# Patient Record
Sex: Male | Born: 1975 | Race: Black or African American | Hispanic: No | Smoking: Never smoker
Health system: Southern US, Community
[De-identification: ages and names within clinical notes are randomized; demographics above are authoritative.]

## PROBLEM LIST (undated history)

## (undated) DIAGNOSIS — I10 Essential (primary) hypertension: Secondary | ICD-10-CM

## (undated) DIAGNOSIS — I82409 Acute embolism and thrombosis of unspecified deep veins of unspecified lower extremity: Secondary | ICD-10-CM

## (undated) DIAGNOSIS — F329 Major depressive disorder, single episode, unspecified: Secondary | ICD-10-CM

## (undated) DIAGNOSIS — K219 Gastro-esophageal reflux disease without esophagitis: Secondary | ICD-10-CM

## (undated) DIAGNOSIS — F419 Anxiety disorder, unspecified: Secondary | ICD-10-CM

## (undated) DIAGNOSIS — F32A Depression, unspecified: Secondary | ICD-10-CM

## (undated) DIAGNOSIS — E119 Type 2 diabetes mellitus without complications: Secondary | ICD-10-CM

## (undated) DIAGNOSIS — E785 Hyperlipidemia, unspecified: Secondary | ICD-10-CM

## (undated) HISTORY — PX: HERNIA REPAIR: SHX51

---

## 2015-03-26 ENCOUNTER — Encounter (HOSPITAL_BASED_OUTPATIENT_CLINIC_OR_DEPARTMENT_OTHER): Payer: Self-pay | Admitting: Emergency Medicine

## 2015-03-26 ENCOUNTER — Emergency Department (HOSPITAL_BASED_OUTPATIENT_CLINIC_OR_DEPARTMENT_OTHER)
Admission: EM | Admit: 2015-03-26 | Discharge: 2015-03-26 | Disposition: A | Discharge status: Home / Self Care | Payer: BLUE CROSS/BLUE SHIELD | Attending: Emergency Medicine | Admitting: Emergency Medicine

## 2015-03-26 ENCOUNTER — Emergency Department (HOSPITAL_BASED_OUTPATIENT_CLINIC_OR_DEPARTMENT_OTHER): Payer: BLUE CROSS/BLUE SHIELD

## 2015-03-26 DIAGNOSIS — I10 Essential (primary) hypertension: Secondary | ICD-10-CM | POA: Diagnosis not present

## 2015-03-26 DIAGNOSIS — J069 Acute upper respiratory infection, unspecified: Secondary | ICD-10-CM | POA: Insufficient documentation

## 2015-03-26 DIAGNOSIS — R05 Cough: Secondary | ICD-10-CM | POA: Diagnosis present

## 2015-03-26 HISTORY — DX: Essential (primary) hypertension: I10

## 2015-03-26 MED ORDER — IBUPROFEN 800 MG PO TABS
800.0000 mg | ORAL_TABLET | Freq: Once | ORAL | Status: AC
  Administered 2015-03-26: 800 mg via ORAL
  Filled 2015-03-26: qty 1

## 2015-03-26 NOTE — ED Provider Notes (Signed)
TIME SEEN: 11:40 AM  CHIEF COMPLAINT: Cough  HPI: Pt is a 39 y.o. male with history of hypertension who presents to the emergency department with 4 days of cough with yellow sputum. States his sputum was blood streaked today and he was concerned so he came to the emergency department. Has had diffuse throbbing headache, subjective fevers. He states he has felt weak today. No nausea or vomiting. Did have 2 episodes of nonbloody diarrhea. States he has not had any sick contacts or recent travel. He did have an influenza vaccination last year. No history of PE or DVT, lower extremity swelling or pain, recent prolonged immobilization such as hospitalization, long flight, fracture, surgery, trauma.  ROS: See HPI Constitutional: Subjective fever  Eyes: no drainage  ENT: no runny nose   Cardiovascular:  no chest pain  Resp: no SOB  GI: no vomiting GU: no dysuria Integumentary: no rash  Allergy: no hives  Musculoskeletal: no leg swelling  Neurological: no slurred speech ROS otherwise negative  PAST MEDICAL HISTORY/PAST SURGICAL HISTORY:  Past Medical History  Diagnosis Date  . Hypertension     MEDICATIONS:  Prior to Admission medications   Not on File    ALLERGIES:  No Known Allergies  SOCIAL HISTORY:  History  Substance Use Topics  . Smoking status: Never Smoker   . Smokeless tobacco: Never Used  . Alcohol Use: Yes     Comment: occasional    FAMILY HISTORY: History reviewed. No pertinent family history.  EXAM: BP 148/98 mmHg  Pulse 97  Temp(Src) 98.7 F (37.1 C) (Oral)  Resp 18  Ht 5\' 9"  (1.753 m)  Wt 224 lb (101.606 kg)  BMI 33.06 kg/m2  SpO2 98% CONSTITUTIONAL: Alert and oriented and responds appropriately to questions. Well-appearing; well-nourished, nontoxic, in no distress HEAD: Normocephalic EYES: Conjunctivae clear, PERRL ENT: normal nose; no rhinorrhea; moist mucous membranes; pharynx without lesions noted NECK: Supple, no meningismus, no LAD  CARD: RRR;  S1 and S2 appreciated; no murmurs, no clicks, no rubs, no gallops RESP: Normal chest excursion without splinting or tachypnea; breath sounds clear and equal bilaterally; no wheezes, no rhonchi, no rales, no hypoxia or respiratory distress, speaking full sentences ABD/GI: Normal bowel sounds; non-distended; soft, non-tender, no rebound, no guarding, no peritoneal signs BACK:  The back appears normal and is non-tender to palpation, there is no CVA tenderness EXT: Normal ROM in all joints; non-tender to palpation; no edema; normal capillary refill; no cyanosis, no calf tenderness or swelling    SKIN: Normal color for age and race; warm NEURO: Moves all extremities equally, sensation to light touch intact diffusely, cranial nerves II through XII intact PSYCH: The patient's mood and manner are appropriate. Grooming and personal hygiene are appropriate.  MEDICAL DECISION MAKING: Patient here with likely viral URI. Will give ibuprofen for his headache. Chest x-ray shows no acute abnormality. I do not feel he needs to be on antibiotics at this time. Discussed return precautions. I feel he is safe to be discharged home. He verbalizes understanding and is comfortable with plan.       Layla Maw Mignon Bechler, DO 03/26/15 1540

## 2015-03-26 NOTE — Discharge Instructions (Signed)
You may take Ibuprofen 800 mg every 8 hours as needed for fever and pain and Tylenol 1000 mg every 6 hours as needed for fever and pain. Both of these medications are found over-the-counter. Your chest x-ray today was clear and showed no pneumonia.   Upper Respiratory Infection, Adult An upper respiratory infection (URI) is also sometimes known as the common cold. The upper respiratory tract includes the nose, sinuses, throat, trachea, and bronchi. Bronchi are the airways leading to the lungs. Most people improve within 1 week, but symptoms can last up to 2 weeks. A residual cough may last even longer.  CAUSES Many different viruses can infect the tissues lining the upper respiratory tract. The tissues become irritated and inflamed and often become very moist. Mucus production is also common. A cold is contagious. You can easily spread the virus to others by oral contact. This includes kissing, sharing a glass, coughing, or sneezing. Touching your mouth or nose and then touching a surface, which is then touched by another person, can also spread the virus. SYMPTOMS  Symptoms typically develop 1 to 3 days after you come in contact with a cold virus. Symptoms vary from person to person. They may include:  Runny nose.  Sneezing.  Nasal congestion.  Sinus irritation.  Sore throat.  Loss of voice (laryngitis).  Cough.  Fatigue.  Muscle aches.  Loss of appetite.  Headache.  Low-grade fever. DIAGNOSIS  You might diagnose your own cold based on familiar symptoms, since most people get a cold 2 to 3 times a year. Your caregiver can confirm this based on your exam. Most importantly, your caregiver can check that your symptoms are not due to another disease such as strep throat, sinusitis, pneumonia, asthma, or epiglottitis. Blood tests, throat tests, and X-rays are not necessary to diagnose a common cold, but they may sometimes be helpful in excluding other more serious diseases. Your  caregiver will decide if any further tests are required. RISKS AND COMPLICATIONS  You may be at risk for a more severe case of the common cold if you smoke cigarettes, have chronic heart disease (such as heart failure) or lung disease (such as asthma), or if you have a weakened immune system. The very young and very old are also at risk for more serious infections. Bacterial sinusitis, middle ear infections, and bacterial pneumonia can complicate the common cold. The common cold can worsen asthma and chronic obstructive pulmonary disease (COPD). Sometimes, these complications can require emergency medical care and may be life-threatening. PREVENTION  The best way to protect against getting a cold is to practice good hygiene. Avoid oral or hand contact with people with cold symptoms. Wash your hands often if contact occurs. There is no clear evidence that vitamin C, vitamin E, echinacea, or exercise reduces the chance of developing a cold. However, it is always recommended to get plenty of rest and practice good nutrition. TREATMENT  Treatment is directed at relieving symptoms. There is no cure. Antibiotics are not effective, because the infection is caused by a virus, not by bacteria. Treatment may include:  Increased fluid intake. Sports drinks offer valuable electrolytes, sugars, and fluids.  Breathing heated mist or steam (vaporizer or shower).  Eating chicken soup or other clear broths, and maintaining good nutrition.  Getting plenty of rest.  Using gargles or lozenges for comfort.  Controlling fevers with ibuprofen or acetaminophen as directed by your caregiver.  Increasing usage of your inhaler if you have asthma. Zinc gel and zinc  lozenges, taken in the first 24 hours of the common cold, can shorten the duration and lessen the severity of symptoms. Pain medicines may help with fever, muscle aches, and throat pain. A variety of non-prescription medicines are available to treat congestion  and runny nose. Your caregiver can make recommendations and may suggest nasal or lung inhalers for other symptoms.  HOME CARE INSTRUCTIONS   Only take over-the-counter or prescription medicines for pain, discomfort, or fever as directed by your caregiver.  Use a warm mist humidifier or inhale steam from a shower to increase air moisture. This may keep secretions moist and make it easier to breathe.  Drink enough water and fluids to keep your urine clear or pale yellow.  Rest as needed.  Return to work when your temperature has returned to normal or as your caregiver advises. You may need to stay home longer to avoid infecting others. You can also use a face mask and careful hand washing to prevent spread of the virus. SEEK MEDICAL CARE IF:   After the first few days, you feel you are getting worse rather than better.  You need your caregiver's advice about medicines to control symptoms.  You develop chills, worsening shortness of breath, or brown or red sputum. These may be signs of pneumonia.  You develop yellow or brown nasal discharge or pain in the face, especially when you bend forward. These may be signs of sinusitis.  You develop a fever, swollen neck glands, pain with swallowing, or white areas in the back of your throat. These may be signs of strep throat. SEEK IMMEDIATE MEDICAL CARE IF:   You have a fever.  You develop severe or persistent headache, ear pain, sinus pain, or chest pain.  You develop wheezing, a prolonged cough, cough up blood, or have a change in your usual mucus (if you have chronic lung disease).  You develop sore muscles or a stiff neck. Document Released: 03/26/2001 Document Revised: 12/23/2011 Document Reviewed: 01/05/2014 [facility] Patient Information 2015 [place], [place]. This information is not intended to replace advice given to you by your health care provider. Make sure you discuss any questions you have with your health care provider.

## 2015-03-26 NOTE — ED Notes (Signed)
Pt in c/o not feeling well x 4 days. States fatigue and nasal congestion, also productive cough with yellow sputum and some blood.

## 2017-12-28 ENCOUNTER — Encounter (HOSPITAL_BASED_OUTPATIENT_CLINIC_OR_DEPARTMENT_OTHER): Payer: Self-pay | Admitting: *Deleted

## 2017-12-28 ENCOUNTER — Other Ambulatory Visit: Payer: Self-pay

## 2017-12-28 ENCOUNTER — Emergency Department (HOSPITAL_BASED_OUTPATIENT_CLINIC_OR_DEPARTMENT_OTHER)
Admission: EM | Admit: 2017-12-28 | Discharge: 2017-12-28 | Disposition: A | Discharge status: Home / Self Care | Payer: BLUE CROSS/BLUE SHIELD | Attending: Emergency Medicine | Admitting: Emergency Medicine

## 2017-12-28 DIAGNOSIS — Z79899 Other long term (current) drug therapy: Secondary | ICD-10-CM | POA: Insufficient documentation

## 2017-12-28 DIAGNOSIS — I10 Essential (primary) hypertension: Secondary | ICD-10-CM | POA: Diagnosis not present

## 2017-12-28 DIAGNOSIS — K625 Hemorrhage of anus and rectum: Secondary | ICD-10-CM | POA: Diagnosis present

## 2017-12-28 NOTE — Discharge Instructions (Signed)
You may use MiraLAX once or twice a day to help keep your stool soft.  To find a primary care or specialty doctor please call [phone] or [phone] to access "[facility] Find a Doctor Service."  You may also go on the [facility] website at [email or web address]  There are also multiple [facility], [facility], [facility] and [facility] practices throughout [place] that are frequently accepting new patients. You may find a clinic that is close to your home and contact them.  [facility] -  [address] [place] [phone]   [facility] -  [address] [place] [id number] [phone]   [facility] [phone]  [place] [phone]

## 2017-12-28 NOTE — ED Provider Notes (Signed)
TIME SEEN: 1:39 AM  CHIEF COMPLAINT: Rectal bleeding  HPI: Patient is a 42 year old male with history of hypertension who presents to the emergency department with rectal bleeding for the past 2 days.  States he is seen a small amount of blood on the toilet paper in the toilet after bowel movements.  Reports his bowel movements have been harder than normal.  No black or tarry stools.  Has had some intermittent abdominal cramping but none currently.  No fever.  No vomiting.  Not on blood thinners.  States he became concerned tonight at work when he saw blood in his toilet paper again and left work for evaluation.  States he is concerned about colon cancer.  No history of colonoscopy.  No family history of colon cancer.  ROS: See HPI Constitutional: no fever  Eyes: no drainage  ENT: no runny nose   Cardiovascular:  no chest pain  Resp: no SOB  GI: no vomiting GU: no dysuria Integumentary: no rash  Allergy: no hives  Musculoskeletal: no leg swelling  Neurological: no slurred speech ROS otherwise negative  PAST MEDICAL HISTORY/PAST SURGICAL HISTORY:  Past Medical History:  Diagnosis Date  . Hypertension     MEDICATIONS:  Prior to Admission medications   Medication Sig Start Date End Date Taking? Authorizing Provider  lisinopril (PRINIVIL,ZESTRIL) 20 MG tablet Take 20 mg by mouth daily.   Yes [provider]    ALLERGIES:  No Known Allergies  SOCIAL HISTORY:  Social History   Tobacco Use  . Smoking status: Never Smoker  . Smokeless tobacco: Never Used  Substance Use Topics  . Alcohol use: No    Frequency: Never    Comment: occasional    FAMILY HISTORY: No family history on file.  EXAM: BP (!) 161/117 (BP Location: Right Arm)   Pulse 98   Temp 99.3 F (37.4 C) (Oral)   Resp 18   Ht 6\' 1"  (1.854 m)   Wt 101.6 kg (224 lb)   SpO2 98%   BMI 29.55 kg/m  CONSTITUTIONAL: Alert and oriented and responds appropriately to questions. Well-appearing;  well-nourished HEAD: Normocephalic EYES: Conjunctivae clear, pupils appear equal, EOMI ENT: normal nose; moist mucous membranes, pink conjunctiva NECK: Supple, no meningismus, no nuchal rigidity, no LAD  CARD: RRR; S1 and S2 appreciated; no murmurs, no clicks, no rubs, no gallops RESP: Normal chest excursion without splinting or tachypnea; breath sounds clear and equal bilaterally; no wheezes, no rhonchi, no rales, no hypoxia or respiratory distress, speaking full sentences ABD/GI: Normal bowel sounds; non-distended; soft, non-tender, no rebound, no guarding, no peritoneal signs, no hepatosplenomegaly RECTAL:  Normal rectal tone, no gross blood or melena, no hemorrhoids appreciated, nontender rectal exam, no fecal impaction BACK:  The back appears normal and is non-tender to palpation, there is no CVA tenderness EXT: Normal ROM in all joints; non-tender to palpation; no edema; normal capillary refill; no cyanosis, no calf tenderness or swelling    SKIN: Normal color for age and race; warm; no rash NEURO: Moves all extremities equally PSYCH: The patient's mood and manner are appropriate. Grooming and personal hygiene are appropriate.  MEDICAL DECISION MAKING: Patient here with rectal bleeding.  There is no blood seen on rectal exam currently.  Abdominal exam benign.  Suspect constipation, internal hemorrhoids.  Will have him follow-up with GI as an outpatient if symptoms continue.  Doubt diverticulosis, diverticulitis.  Doubt upper GI bleed.  Nothing to suggest hemorrhage.  Hemodynamically stable.  Recommended MiraLAX and high-fiber diet to  keep his stool soft.  Of note patient is also hypertensive.  He is asymptomatic at this time.  He is on lisinopril.  Have advised him to continue this medication and follow-up closely with his primary care physician.   At this time, I do not feel there is any life-threatening condition present. I have reviewed and discussed all results (EKG, imaging, lab, urine  as appropriate) and exam findings with patient/family. I have reviewed nursing notes and appropriate previous records.  I feel the patient is safe to be discharged home without further emergent workup and can continue workup as an outpatient as needed. Discussed usual and customary return precautions. Patient/family verbalize understanding and are comfortable with this plan.  Outpatient follow-up has been provided if needed. All questions have been answered.      Ward, Layla MawKristen N, DO 12/28/17 [phone]

## 2017-12-28 NOTE — ED Notes (Signed)
ED Provider at bedside. 

## 2017-12-28 NOTE — ED Triage Notes (Signed)
Pt reports painful bm and rectal bleeding when he wiped. Also c/o generalized back pain x 1 week. Ambulatory to room 12 without difficulty

## 2017-12-28 NOTE — ED Notes (Signed)
Pt states about 2 weeks ago, he noticed some pain in his groin and pressure from his lower back up to his neck. Last Wed., noticed a small blood clot on his stool in the toilet. Had been drinking cranberry juice. Tonight, noticed blood in the toilet water again and became concerned. Also [facility] and "not had any [facility]." BBS-clr.

## 2017-12-28 NOTE — ED Notes (Signed)
Pt given d/c instructions as per chart. Verbalizes understanding. No questions. 

## 2017-12-30 ENCOUNTER — Encounter: Payer: Self-pay | Admitting: Gastroenterology

## 2018-02-12 ENCOUNTER — Ambulatory Visit: Payer: BLUE CROSS/BLUE SHIELD | Admitting: Gastroenterology

## 2018-04-17 ENCOUNTER — Encounter (HOSPITAL_BASED_OUTPATIENT_CLINIC_OR_DEPARTMENT_OTHER): Payer: Self-pay | Admitting: *Deleted

## 2018-04-17 ENCOUNTER — Other Ambulatory Visit: Payer: Self-pay

## 2018-04-17 ENCOUNTER — Emergency Department (HOSPITAL_BASED_OUTPATIENT_CLINIC_OR_DEPARTMENT_OTHER)
Admission: EM | Admit: 2018-04-17 | Discharge: 2018-04-17 | Disposition: A | Discharge status: Home / Self Care | Payer: BLUE CROSS/BLUE SHIELD | Attending: Emergency Medicine | Admitting: Emergency Medicine

## 2018-04-17 DIAGNOSIS — I10 Essential (primary) hypertension: Secondary | ICD-10-CM | POA: Diagnosis not present

## 2018-04-17 DIAGNOSIS — K625 Hemorrhage of anus and rectum: Secondary | ICD-10-CM | POA: Insufficient documentation

## 2018-04-17 DIAGNOSIS — Z79899 Other long term (current) drug therapy: Secondary | ICD-10-CM | POA: Insufficient documentation

## 2018-04-17 HISTORY — DX: Major depressive disorder, single episode, unspecified: F32.9

## 2018-04-17 HISTORY — DX: Anxiety disorder, unspecified: F41.9

## 2018-04-17 HISTORY — DX: Depression, unspecified: F32.A

## 2018-04-17 HISTORY — DX: Hyperlipidemia, unspecified: E78.5

## 2018-04-17 HISTORY — DX: Gastro-esophageal reflux disease without esophagitis: K21.9

## 2018-04-17 LAB — CBC WITH DIFFERENTIAL/PLATELET
BASOS ABS: 0.1 10*3/uL (ref 0.0–0.1)
BASOS PCT: 1 %
Eosinophils Absolute: 0.1 10*3/uL (ref 0.0–0.7)
Eosinophils Relative: 1 %
HCT: 38.1 % — ABNORMAL LOW (ref 39.0–52.0)
HEMOGLOBIN: 12.9 g/dL — AB (ref 13.0–17.0)
Lymphocytes Relative: 31 %
Lymphs Abs: 2.3 10*3/uL (ref 0.7–4.0)
MCH: 29.3 pg (ref 26.0–34.0)
MCHC: 33.9 g/dL (ref 30.0–36.0)
MCV: 86.4 fL (ref 78.0–100.0)
Monocytes Absolute: 0.8 10*3/uL (ref 0.1–1.0)
Monocytes Relative: 10 %
NEUTROS ABS: 4.4 10*3/uL (ref 1.7–7.7)
Neutrophils Relative %: 57 %
Platelets: 331 10*3/uL (ref 150–400)
RBC: 4.41 MIL/uL (ref 4.22–5.81)
RDW: 12.1 % (ref 11.5–15.5)
WBC: 7.6 10*3/uL (ref 4.0–10.5)

## 2018-04-17 LAB — PROTIME-INR
INR: 1.21
PROTHROMBIN TIME: 15.2 s (ref 11.4–15.2)

## 2018-04-17 LAB — BASIC METABOLIC PANEL
ANION GAP: 9 (ref 5–15)
BUN: 13 mg/dL (ref 6–20)
CALCIUM: 8.9 mg/dL (ref 8.9–10.3)
CO2: 25 mmol/L (ref 22–32)
Chloride: 104 mmol/L (ref 98–111)
Creatinine, Ser: 0.97 mg/dL (ref 0.61–1.24)
GFR calc Af Amer: >60 mL/min (ref >60)
GFR calc non Af Amer: >60 mL/min (ref >60)
GLUCOSE: 108 mg/dL — AB (ref 70–99)
Potassium: 3.8 mmol/L (ref 3.5–5.1)
Sodium: 138 mmol/L (ref 135–145)

## 2018-04-17 LAB — URINALYSIS, ROUTINE W REFLEX MICROSCOPIC
Bilirubin Urine: NEGATIVE
GLUCOSE, UA: NEGATIVE mg/dL
Hgb urine dipstick: NEGATIVE
Ketones, ur: NEGATIVE mg/dL
Nitrite: NEGATIVE
PROTEIN: NEGATIVE mg/dL
Specific Gravity, Urine: 1.01 (ref 1.005–1.030)
pH: 6.5 (ref 5.0–8.0)

## 2018-04-17 LAB — URINALYSIS, MICROSCOPIC (REFLEX)

## 2018-04-17 LAB — OCCULT BLOOD X 1 CARD TO LAB, STOOL: Fecal Occult Bld: POSITIVE — AB

## 2018-04-17 MED ORDER — DOCUSATE SODIUM 100 MG PO CAPS: 100.0000 mg | ORAL_CAPSULE | Freq: Two times a day (BID) | ORAL | 0 refills | Status: DC

## 2018-04-17 NOTE — ED Notes (Signed)
Pt is actually complaining of bright red blood from rectum when trying to have a bowel movement, hx of hemorrhoids, denies dizziness, denies SOB, denies abdominal pain

## 2018-04-17 NOTE — ED Notes (Signed)
Pt was sent from Urgent Care

## 2018-04-17 NOTE — ED Notes (Signed)
Pt verbalizes understanding of d/c instructions and denies any further needs at this time. 

## 2018-04-17 NOTE — ED Provider Notes (Signed)
[facility] EMERGENCY DEPARTMENT Provider Note   CSN: [id number] Arrival date & time: 04/17/18  1821     History   Chief Complaint Chief Complaint  Patient presents with  . Rectal Bleeding    HPI Jerome Dickerson is a 42 y.o. male.  HPI Patient referred from PCP office for evaluation of rectal bleeding.  Patient reports that today he had been doing normal activities.  He then went into the bathroom and it seemed like a spurt of red blood came out of his rectum.  He denies he was straining hard at stool.  Reports it happened another time and a small piece of stool came out with it.  Again, this was bright red blood that he reports seem to spray out.  He denies any rectal pain.  Abdominal pain.  No lightheadedness no dizziness.  He reports he is otherwise been feeling well.  He reports sometimes he does have constipation and then amends his diet.  Last time he thought he was constipated was about 2 weeks ago.  No history of colonoscopy. Past Medical History:  Diagnosis Date  . Anxiety   . Depression   . GERD (gastroesophageal reflux disease)   . Hyperlipemia   . Hypertension     There are no active problems to display for this patient.   Past Surgical History:  Procedure Laterality Date  . HERNIA REPAIR          Home Medications    Prior to Admission medications   Medication Sig Start Date End Date Taking? Authorizing Provider  lisinopril (PRINIVIL,ZESTRIL) 20 MG tablet Take 20 mg by mouth daily.   Yes [provider]  docusate sodium (COLACE) 100 MG capsule Take 1 capsule (100 mg total) by mouth every 12 (twelve) hours. 04/17/18   Arby BarrettePfeiffer, Rondale Nies, MD    Family History History reviewed. No pertinent family history.  Social History Social History   Tobacco Use  . Smoking status: Never Smoker  . Smokeless tobacco: Never Used  Substance Use Topics  . Alcohol use: No    Frequency: Never    Comment: occasional  . Drug use: No     Allergies     Patient has no known allergies.   Review of Systems Review of Systems 10 Systems reviewed and are negative for acute change except as noted in the HPI.   Physical Exam Updated Vital Signs BP (!) 157/102 (BP Location: Left Arm)   Pulse 75   Temp 98.6 F (37 C) (Oral)   Resp 20   Ht 6\' 1"  (1.854 m)   Wt 108.9 kg (240 lb)   SpO2 96%   BMI 31.66 kg/m   Physical Exam  Constitutional: He is oriented to person, place, and time. He appears well-developed and well-nourished.  HENT:  Head: Normocephalic and atraumatic.  Eyes: EOM are normal.  Neck: Neck supple.  Cardiovascular: Normal rate, regular rhythm, normal heart sounds and intact distal pulses.  Pulmonary/Chest: Effort normal and breath sounds normal.  Abdominal: Soft. Bowel sounds are normal. He exhibits no distension. There is no tenderness. There is no guarding.  Genitourinary:  Genitourinary Comments: One nonthrombosed hemorrhoid.  No blood visible blood in the rectal vault.  Trace brownish-yellow stool.  Musculoskeletal: Normal range of motion. He exhibits no edema.  Neurological: He is alert and oriented to person, place, and time. He has normal strength. Coordination normal. GCS eye subscore is 4. GCS verbal subscore is 5. GCS motor subscore is 6.  Skin: Skin  is warm, dry and intact.  Psychiatric: He has a normal mood and affect.     ED Treatments / Results  Labs (all labs ordered are listed, but only abnormal results are displayed) Labs Reviewed  URINALYSIS, ROUTINE W REFLEX MICROSCOPIC - Abnormal; Notable for the following components:      Result Value   Leukocytes, UA TRACE (*)    All other components within normal limits  URINALYSIS, MICROSCOPIC (REFLEX) - Abnormal; Notable for the following components:   Bacteria, UA FEW (*)    All other components within normal limits  BASIC METABOLIC PANEL - Abnormal; Notable for the following components:   Glucose, Bld 108 (*)    All other components within normal  limits  CBC WITH DIFFERENTIAL/PLATELET - Abnormal; Notable for the following components:   Hemoglobin 12.9 (*)    HCT 38.1 (*)    All other components within normal limits  PROTIME-INR  OCCULT BLOOD X 1 CARD TO LAB, STOOL    EKG None  Radiology No results found.  Procedures Procedures (including critical care time)  Medications Ordered in ED Medications - No data to display   Initial Impression / Assessment and Plan / ED Course  I have reviewed the triage vital signs and the nursing notes.  Pertinent labs & imaging results that were available during my care of the patient were reviewed by me and considered in my medical decision making (see chart for details).     Final Clinical Impressions(s) / ED Diagnoses   Final diagnoses:  Rectal bleeding   Patient describes bright red rectal bleeding today.  I highly suspect hemorrhoidal bleeding.  Exam documented earlier PCP showed some trace bright red blood.  Exam that I have performed in the emergency department does not show any macroscopically visible blood at this time.  Patient does have blood count of 12.9 mg/dl that I have no old comparison available.  Clinically, patient shows no signs of anemia.  He is not hypotensive, tachycardic or symptomatic.  At this time, I feel patient stable for discharge with follow-up with his PCP to arrange outpatient colonoscopy.  He is counseled on necessity to return should he develop symptomatic anemia or increasing blood loss. ED Discharge Orders        Ordered    docusate sodium (COLACE) 100 MG capsule  Every 12 hours     04/17/18 2034       Arby Barrette, MD 04/17/18 2044

## 2018-04-17 NOTE — ED Triage Notes (Signed)
Pt had blood in urine several times today.

## 2018-04-17 NOTE — Discharge Instructions (Signed)
1.  Start taking Colace twice daily. 2.  Try not to strain or push hard while having a bowel movement. 3.  Schedule follow-up with your family doctor next week to discuss follow-up colonoscopy. 4.  Return to the emergency department if you develop lightheadedness, feeling generally weak, feeling like you will pass out, shortness of breath or increasing volume of blood.

## 2018-08-16 DIAGNOSIS — I10 Essential (primary) hypertension: Secondary | ICD-10-CM | POA: Diagnosis not present

## 2018-08-16 DIAGNOSIS — M79642 Pain in left hand: Secondary | ICD-10-CM | POA: Diagnosis present

## 2018-08-16 DIAGNOSIS — R2232 Localized swelling, mass and lump, left upper limb: Secondary | ICD-10-CM | POA: Diagnosis not present

## 2018-08-17 ENCOUNTER — Other Ambulatory Visit: Payer: Self-pay

## 2018-08-17 ENCOUNTER — Emergency Department (HOSPITAL_BASED_OUTPATIENT_CLINIC_OR_DEPARTMENT_OTHER)
Admission: EM | Admit: 2018-08-17 | Discharge: 2018-08-17 | Disposition: A | Discharge status: Home / Self Care | Payer: BLUE CROSS/BLUE SHIELD | Attending: Emergency Medicine | Admitting: Emergency Medicine

## 2018-08-17 ENCOUNTER — Encounter (HOSPITAL_BASED_OUTPATIENT_CLINIC_OR_DEPARTMENT_OTHER): Payer: Self-pay | Admitting: *Deleted

## 2018-08-17 DIAGNOSIS — M7989 Other specified soft tissue disorders: Secondary | ICD-10-CM

## 2018-08-17 MED ORDER — AMLODIPINE BESYLATE 5 MG PO TABS
5.0000 mg | ORAL_TABLET | Freq: Once | ORAL | Status: AC
  Administered 2018-08-17: 5 mg via ORAL
  Filled 2018-08-17: qty 1

## 2018-08-17 MED ORDER — AMLODIPINE BESYLATE 5 MG PO TABS: 5.0000 mg | ORAL_TABLET | Freq: Every day | ORAL | 1 refills | Status: DC

## 2018-08-17 MED ORDER — ACETAMINOPHEN 500 MG PO TABS
1000.0000 mg | ORAL_TABLET | Freq: Once | ORAL | Status: AC
  Administered 2018-08-17: 1000 mg via ORAL
  Filled 2018-08-17: qty 2

## 2018-08-17 NOTE — ED Triage Notes (Signed)
C/o left hand pain and swelling since Friday. Denies injury/illness

## 2018-08-17 NOTE — ED Provider Notes (Signed)
[facility] EMERGENCY DEPARTMENT Provider Note  CSN: [id number] Arrival date & time: 08/16/18 2358  Chief Complaint(s) Hand Pain  HPI Jerome Dickerson is a 42 y.o. male   The history is provided by the patient.  Hand Pain  This is a new problem. The current episode started 2 days ago. The problem occurs constantly. The problem has been gradually worsening. Pertinent negatives include no chest pain, no abdominal pain, no headaches and no shortness of breath. Nothing aggravates the symptoms. Nothing relieves the symptoms. He has tried nothing for the symptoms.   Patient denies trauma or injury to the hand. No drug use. No h/o gout.  No prior or recent STD.   Past Medical History Past Medical History:  Diagnosis Date  . Anxiety   . Depression   . GERD (gastroesophageal reflux disease)   . Hyperlipemia   . Hypertension    There are no active problems to display for this patient.  Home Medication(s) Prior to Admission medications   Medication Sig Start Date End Date Taking? Authorizing Provider  amLODipine (NORVASC) 5 MG tablet Take 1 tablet (5 mg total) by mouth daily for 7 days. 08/17/18 08/24/18  Nira Conn, MD  docusate sodium (COLACE) 100 MG capsule Take 1 capsule (100 mg total) by mouth every 12 (twelve) hours. 04/17/18   Arby Barrette, MD                                                                                                                                    Past Surgical History Past Surgical History:  Procedure Laterality Date  . HERNIA REPAIR     Family History No family history on file.  Social History Social History   Tobacco Use  . Smoking status: Never Smoker  . Smokeless tobacco: Never Used  Substance Use Topics  . Alcohol use: Yes    Frequency: Never    Comment: occasional  . Drug use: No   Allergies Patient has no known allergies.  Review of Systems Review of Systems  Respiratory: Negative for shortness of breath.     Cardiovascular: Negative for chest pain.  Gastrointestinal: Negative for abdominal pain.  Neurological: Negative for headaches.   All other systems are reviewed and are negative for acute change except as noted in the HPI  Physical Exam Vital Signs  I have reviewed the triage vital signs BP (!) 166/114   Pulse 84   Temp 98.5 F (36.9 C) (Oral)   Resp 16   SpO2 98%   Physical Exam  Constitutional: He is oriented to person, place, and time. He appears well-developed and well-nourished. No distress.  HENT:  Head: Normocephalic and atraumatic.  Right Ear: External ear normal.  Left Ear: External ear normal.  Nose: Nose normal.  Mouth/Throat: Mucous membranes are normal. No trismus in the jaw.  Eyes: Conjunctivae and EOM are normal. No scleral icterus.  Neck: Normal range of  motion and phonation normal.  Cardiovascular: Normal rate and regular rhythm.  Pulmonary/Chest: Effort normal. No stridor. No respiratory distress.  Abdominal: He exhibits no distension.  Musculoskeletal: Normal range of motion. He exhibits no edema.       Hands: Neurological: He is alert and oriented to person, place, and time.  Skin: He is not diaphoretic.  Psychiatric: He has a normal mood and affect. His behavior is normal.  Vitals reviewed.   ED Results and Treatments Labs (all labs ordered are listed, but only abnormal results are displayed) Labs Reviewed - No data to display                                                                                                                       EKG  EKG Interpretation  Date/Time:    Ventricular Rate:    PR Interval:    QRS Duration:   QT Interval:    QTC Calculation:   R Axis:     Text Interpretation:        Radiology No results found. Pertinent labs & imaging results that were available during my care of the patient were reviewed by me and considered in my medical decision making (see chart for details).  Medications Ordered in  ED Medications  acetaminophen (TYLENOL) tablet 1,000 mg (1,000 mg Oral Given 08/17/18 0048)  amLODipine (NORVASC) tablet 5 mg (5 mg Oral Given 08/17/18 0048)                                                                                                                                    Procedures Procedures  (including critical care time)  Medical Decision Making / ED Course I have reviewed the nursing notes for this encounter and the patient's prior records (if available in EHR or on provided paperwork).    Patient presents with left hand swelling.  He denies any trauma, recent infections.  Doubt septic arthritis or STD related arthritis.  Doubt gout.  Patient is on lisinopril.  Believe this is angioedema of the hand.  Will discontinue ACE inhibitor and place patient on amlodipine.  Recommend close follow-up with his PCP this week for reassessment and to ensure amlodipine will be appropriate for the patient.  The patient appears reasonably screened and/or stabilized for discharge and I doubt any other medical condition or other [facility] requiring further screening, evaluation, or treatment in the ED at this time  prior to discharge.  The patient is safe for discharge with strict return precautions.   Final Clinical Impression(s) / ED Diagnoses Final diagnoses:  Swelling of left hand    Disposition: Discharge  Condition: Good  I have discussed the results, Dx and Tx plan with the patient who expressed understanding and agree(s) with the plan. Discharge instructions discussed at great length. The patient was given strict return precautions who verbalized understanding of the instructions. No further questions at time of discharge.    ED Discharge Orders         Ordered    amLODipine (NORVASC) 5 MG tablet  Daily     08/17/18 0158           Follow Up: Ananias Pilgrim, MD [address] [place] [address] [phone]  Schedule an appointment as soon as  possible for a visit  in 3-5 days, for close follow up to assess for possible angioedema from Lisinopril     This chart was dictated using voice recognition software.  Despite best efforts to proofread,  errors can occur which can change the documentation meaning.   Nira Conn, MD 08/17/18 [phone]

## 2019-06-11 ENCOUNTER — Emergency Department (HOSPITAL_BASED_OUTPATIENT_CLINIC_OR_DEPARTMENT_OTHER): Payer: BC Managed Care – PPO

## 2019-06-11 ENCOUNTER — Encounter (HOSPITAL_BASED_OUTPATIENT_CLINIC_OR_DEPARTMENT_OTHER): Payer: Self-pay

## 2019-06-11 ENCOUNTER — Other Ambulatory Visit: Payer: Self-pay

## 2019-06-11 ENCOUNTER — Emergency Department (HOSPITAL_BASED_OUTPATIENT_CLINIC_OR_DEPARTMENT_OTHER)
Admission: EM | Admit: 2019-06-11 | Discharge: 2019-06-11 | Disposition: A | Discharge status: Home / Self Care | Payer: BC Managed Care – PPO | Attending: Emergency Medicine | Admitting: Emergency Medicine

## 2019-06-11 DIAGNOSIS — E785 Hyperlipidemia, unspecified: Secondary | ICD-10-CM | POA: Insufficient documentation

## 2019-06-11 DIAGNOSIS — I82431 Acute embolism and thrombosis of right popliteal vein: Secondary | ICD-10-CM

## 2019-06-11 DIAGNOSIS — I10 Essential (primary) hypertension: Secondary | ICD-10-CM | POA: Diagnosis not present

## 2019-06-11 DIAGNOSIS — Z79899 Other long term (current) drug therapy: Secondary | ICD-10-CM | POA: Diagnosis not present

## 2019-06-11 DIAGNOSIS — E790 Hyperuricemia without signs of inflammatory arthritis and tophaceous disease: Secondary | ICD-10-CM | POA: Insufficient documentation

## 2019-06-11 DIAGNOSIS — R2241 Localized swelling, mass and lump, right lower limb: Secondary | ICD-10-CM | POA: Diagnosis present

## 2019-06-11 LAB — COMPREHENSIVE METABOLIC PANEL
ALT: 40 U/L (ref 0–44)
AST: 34 U/L (ref 15–41)
Albumin: 4 g/dL (ref 3.5–5.0)
Alkaline Phosphatase: 70 U/L (ref 38–126)
Anion gap: 12 (ref 5–15)
BUN: 13 mg/dL (ref 6–20)
CO2: 23 mmol/L (ref 22–32)
Calcium: 9.4 mg/dL (ref 8.9–10.3)
Chloride: 102 mmol/L (ref 98–111)
Creatinine, Ser: 1 mg/dL (ref 0.61–1.24)
GFR calc Af Amer: >60 mL/min (ref >60)
GFR calc non Af Amer: >60 mL/min (ref >60)
Glucose, Bld: 108 mg/dL — ABNORMAL HIGH (ref 70–99)
Potassium: 3.9 mmol/L (ref 3.5–5.1)
Sodium: 137 mmol/L (ref 135–145)
Total Bilirubin: 0.8 mg/dL (ref 0.3–1.2)
Total Protein: 7.9 g/dL (ref 6.5–8.1)

## 2019-06-11 LAB — CBC WITH DIFFERENTIAL/PLATELET
Abs Immature Granulocytes: 0.04 10*3/uL (ref 0.00–0.07)
Basophils Absolute: 0.1 10*3/uL (ref 0.0–0.1)
Basophils Relative: 1 %
Eosinophils Absolute: 0.1 10*3/uL (ref 0.0–0.5)
Eosinophils Relative: 1 %
HCT: 40 % (ref 39.0–52.0)
Hemoglobin: 12.8 g/dL — ABNORMAL LOW (ref 13.0–17.0)
Immature Granulocytes: 0 %
Lymphocytes Relative: 27 %
Lymphs Abs: 2.5 10*3/uL (ref 0.7–4.0)
MCH: 28.5 pg (ref 26.0–34.0)
MCHC: 32 g/dL (ref 30.0–36.0)
MCV: 89.1 fL (ref 80.0–100.0)
Monocytes Absolute: 0.9 10*3/uL (ref 0.1–1.0)
Monocytes Relative: 10 %
Neutro Abs: 5.7 10*3/uL (ref 1.7–7.7)
Neutrophils Relative %: 61 %
Platelets: 344 10*3/uL (ref 150–400)
RBC: 4.49 MIL/uL (ref 4.22–5.81)
RDW: 12.3 % (ref 11.5–15.5)
WBC: 9.2 10*3/uL (ref 4.0–10.5)
nRBC: 0 % (ref 0.0–0.2)

## 2019-06-11 LAB — URIC ACID: Uric Acid, Serum: 10.3 mg/dL — ABNORMAL HIGH (ref 3.7–8.6)

## 2019-06-11 MED ORDER — RIVAROXABAN 15 MG PO TABS
15.0000 mg | ORAL_TABLET | Freq: Once | ORAL | Status: DC
  Filled 2019-06-11: qty 1

## 2019-06-11 MED ORDER — RIVAROXABAN (XARELTO) EDUCATION KIT FOR DVT/PE PATIENTS: PACK | Freq: Once | Status: DC

## 2019-06-11 MED ORDER — RIVAROXABAN (XARELTO) VTE STARTER PACK (15 & 20 MG): ORAL_TABLET | ORAL | 0 refills | Status: DC

## 2019-06-11 MED ORDER — RIVAROXABAN 15 MG PO TABS: 15.0000 mg | ORAL_TABLET | Freq: Two times a day (BID) | ORAL | Status: DC

## 2019-06-11 MED ORDER — RIVAROXABAN 15 MG PO TABS
15.0000 mg | ORAL_TABLET | Freq: Once | ORAL | Status: AC
  Administered 2019-06-11: 15 mg via ORAL

## 2019-06-11 NOTE — ED Notes (Signed)
ED Provider at bedside. 

## 2019-06-11 NOTE — ED Triage Notes (Signed)
Pt c/o swelling to right leg x 1 month-NAD-steady gait

## 2019-06-11 NOTE — Discharge Instructions (Signed)
You have blood clots in your right leg.   You are given xarelto education packet and coupon. Take the starter pack as prescribed   You have elevated uric acid level which can happen with blood clot. Recommend repeat level in a week   You need repeat leg ultrasound in 1-2 months to make sure your clots are resolved   See your doctor in a week   Return to ER if you have worse leg pain or swelling, trouble breathing, shortness of breath, palpitations

## 2019-06-11 NOTE — ED Provider Notes (Signed)
[facility] EMERGENCY DEPARTMENT Provider Note   CSN: [id number] Arrival date & time: 06/11/19  1539     History   Chief Complaint Chief Complaint  Patient presents with  . Leg Swelling    HPI Tyreck Mcquain is a 43 y.o. male history of hypertension on Norvasc, here presenting with right leg and foot swelling. Patient states that his right leg is been swollen for the last month or so.  He states that for the last week or so he has been having right big toe pain and foot swelling as well.  Denies any chest pain or shortness of breath.  Denies any recent travels or history of blood clots.  Patient is taking Norvasc for his hypertension.  Denies any fevers or chills or cough     The history is provided by the patient.    Past Medical History:  Diagnosis Date  . Anxiety   . Depression   . GERD (gastroesophageal reflux disease)   . Hyperlipemia   . Hypertension     There are no active problems to display for this patient.   Past Surgical History:  Procedure Laterality Date  . HERNIA REPAIR          Home Medications    Prior to Admission medications   Medication Sig Start Date End Date Taking? Authorizing Provider  amLODipine (NORVASC) 5 MG tablet Take 1 tablet (5 mg total) by mouth daily for 7 days. 08/17/18 08/24/18  Nira Connardama, Pedro Eduardo, MD  docusate sodium (COLACE) 100 MG capsule Take 1 capsule (100 mg total) by mouth every 12 (twelve) hours. 04/17/18   Arby BarrettePfeiffer, Marcy, MD    Family History No family history on file.  Social History Social History   Tobacco Use  . Smoking status: Never Smoker  . Smokeless tobacco: Never Used  Substance Use Topics  . Alcohol use: Yes    Frequency: Never    Comment: weekly  . Drug use: No     Allergies   Patient has no known allergies.   Review of Systems Review of Systems  Cardiovascular: Positive for leg swelling.  All other systems reviewed and are negative.    Physical Exam Updated Vital Signs  BP (!) 152/98 (BP Location: Left Arm)   Pulse 88   Temp 99.4 F (37.4 C) (Oral)   Resp 20   Ht 6\' 1"  (1.854 m)   Wt 112.9 kg   SpO2 98%   BMI 32.85 kg/m   Physical Exam Vitals signs reviewed.  HENT:     Head: Normocephalic.     Nose: Nose normal.     Mouth/Throat:     Mouth: Mucous membranes are moist.  Eyes:     Extraocular Movements: Extraocular movements intact.     Pupils: Pupils are equal, round, and reactive to light.  Neck:     Musculoskeletal: Normal range of motion.  Cardiovascular:     Rate and Rhythm: Normal rate and regular rhythm.     Pulses: Normal pulses.  Pulmonary:     Effort: Pulmonary effort is normal.     Breath sounds: Normal breath sounds.  Abdominal:     General: Abdomen is flat.     Palpations: Abdomen is soft.  Musculoskeletal:     Comments: 1+ R leg edema. Mild R calf tenderness. R MTP joint with some swelling but no erythema. Mild R foot swelling with no signs of ulcers or infection   Skin:    General: Skin is warm.  Capillary Refill: Capillary refill takes less than 2 seconds.  Neurological:     General: No focal deficit present.     Mental Status: He is alert.  Psychiatric:        Mood and Affect: Mood normal.        Behavior: Behavior normal.      ED Treatments / Results  Labs (all labs ordered are listed, but only abnormal results are displayed) Labs Reviewed  CBC WITH DIFFERENTIAL/PLATELET  COMPREHENSIVE METABOLIC PANEL  URIC ACID    EKG None  Radiology No results found.  Procedures Procedures (including critical care time)  Medications Ordered in ED Medications - No data to display   Initial Impression / Assessment and Plan / ED Course  I have reviewed the triage vital signs and the nursing notes.  Pertinent labs & imaging results that were available during my care of the patient were reviewed by me and considered in my medical decision making (see chart for details).       Ralphie Gearin is a 43 y.o.  male here with R foot pain, R leg swelling. Consider side effect of norvasc vs DVT vs gout. Will get labs, uric acid level, DVT study, R foot xray.   5:42 PM DVT study positive for DVT R popliteal, PT and peroneal veins. Xray of the foot showed no changes consistent with gout. Uric acid slightly elevated.  I think the foot swelling and elevated uric acid level may be from the DVT itself.  His creatinine is normal so we will start patient on Xarelto starter pack.  I told him that he will need a repeat ultrasound in a month or so.  He will need to return to the ER if he has shortness of breath or palpitations.   Final Clinical Impressions(s) / ED Diagnoses   Final diagnoses:  None    ED Discharge Orders    None       Drenda Freeze, MD 06/11/19 1744

## 2019-10-06 ENCOUNTER — Emergency Department (HOSPITAL_BASED_OUTPATIENT_CLINIC_OR_DEPARTMENT_OTHER)
Admission: EM | Admit: 2019-10-06 | Discharge: 2019-10-06 | Disposition: A | Discharge status: Home / Self Care | Payer: BC Managed Care – PPO | Attending: Emergency Medicine | Admitting: Emergency Medicine

## 2019-10-06 ENCOUNTER — Encounter (HOSPITAL_BASED_OUTPATIENT_CLINIC_OR_DEPARTMENT_OTHER): Payer: Self-pay

## 2019-10-06 ENCOUNTER — Other Ambulatory Visit: Payer: Self-pay

## 2019-10-06 ENCOUNTER — Emergency Department (HOSPITAL_BASED_OUTPATIENT_CLINIC_OR_DEPARTMENT_OTHER): Payer: BC Managed Care – PPO

## 2019-10-06 DIAGNOSIS — I10 Essential (primary) hypertension: Secondary | ICD-10-CM | POA: Insufficient documentation

## 2019-10-06 DIAGNOSIS — Z86718 Personal history of other venous thrombosis and embolism: Secondary | ICD-10-CM | POA: Insufficient documentation

## 2019-10-06 DIAGNOSIS — R0789 Other chest pain: Secondary | ICD-10-CM | POA: Insufficient documentation

## 2019-10-06 DIAGNOSIS — R0602 Shortness of breath: Secondary | ICD-10-CM | POA: Diagnosis present

## 2019-10-06 LAB — CBC WITH DIFFERENTIAL/PLATELET
Abs Immature Granulocytes: 0.02 10*3/uL (ref 0.00–0.07)
Basophils Absolute: 0 10*3/uL (ref 0.0–0.1)
Basophils Relative: 1 %
Eosinophils Absolute: 0 10*3/uL (ref 0.0–0.5)
Eosinophils Relative: 0 %
HCT: 43.5 % (ref 39.0–52.0)
Hemoglobin: 14.3 g/dL (ref 13.0–17.0)
Immature Granulocytes: 0 %
Lymphocytes Relative: 24 %
Lymphs Abs: 1.8 10*3/uL (ref 0.7–4.0)
MCH: 28.7 pg (ref 26.0–34.0)
MCHC: 32.9 g/dL (ref 30.0–36.0)
MCV: 87.3 fL (ref 80.0–100.0)
Monocytes Absolute: 0.7 10*3/uL (ref 0.1–1.0)
Monocytes Relative: 9 %
Neutro Abs: 4.7 10*3/uL (ref 1.7–7.7)
Neutrophils Relative %: 66 %
Platelets: 368 10*3/uL (ref 150–400)
RBC: 4.98 MIL/uL (ref 4.22–5.81)
RDW: 11.8 % (ref 11.5–15.5)
WBC: 7.3 10*3/uL (ref 4.0–10.5)
nRBC: 0 % (ref 0.0–0.2)

## 2019-10-06 LAB — BRAIN NATRIURETIC PEPTIDE: B Natriuretic Peptide: 24.9 pg/mL (ref 0.0–100.0)

## 2019-10-06 LAB — BASIC METABOLIC PANEL
Anion gap: 12 (ref 5–15)
BUN: 13 mg/dL (ref 6–20)
CO2: 25 mmol/L (ref 22–32)
Calcium: 9.4 mg/dL (ref 8.9–10.3)
Chloride: 101 mmol/L (ref 98–111)
Creatinine, Ser: 1.03 mg/dL (ref 0.61–1.24)
GFR calc Af Amer: >60 mL/min (ref >60)
GFR calc non Af Amer: >60 mL/min (ref >60)
Glucose, Bld: 147 mg/dL — ABNORMAL HIGH (ref 70–99)
Potassium: 4.3 mmol/L (ref 3.5–5.1)
Sodium: 138 mmol/L (ref 135–145)

## 2019-10-06 LAB — TROPONIN I (HIGH SENSITIVITY): Troponin I (High Sensitivity): 5 ng/L (ref <18)

## 2019-10-06 MED ORDER — IOHEXOL 350 MG/ML SOLN
100.0000 mL | Freq: Once | INTRAVENOUS | Status: AC | PRN
  Administered 2019-10-06: 100 mL via INTRAVENOUS

## 2019-10-06 NOTE — ED Triage Notes (Signed)
Pt states that he lost his brother on [place] from a blood clot. Pt states concern for blood clot today. Vague about any symptoms today. 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 Pt reports that he was told to take Xarelto until upcoming January apt. Reports that he has been out of medication.

## 2019-10-06 NOTE — ED Provider Notes (Signed)
[facility] EMERGENCY DEPARTMENT Provider Note   CSN: [id number] Arrival date & time: 10/06/19  0827     History Chief Complaint  Patient presents with  . Shortness of Breath    Jerome Dickerson is a 43 y.o. male.  HPI   43 year old male, with a PMH of HTN, presents with complaints of intermittent pain.  Patient states that his brother passed away on 02-01-23 from a blood clot.  He states since then he has been very anxious and nervous.  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  He states that he finishes Xarelto about 3 weeks ago and did not have a refill ordered by his PCP so he thought he was not supposed to be taking anymore.  He states over the last 2 weeks he has had intermittent pain around the proximal sternum.  He denies any aggravating factors but he states pain improves with drinking water or taking Tylenol.  He denies any associated shortness of breath, nausea, vomiting.  He denies any exertional component to his pain.  Patient is here requesting a scan of his chest to make sure that he does not have a blood clot in his lungs.  He states that his peripheral edema has significantly improved since starting Xarelto approximately 3 months ago.  Past Medical History:  Diagnosis Date  . Anxiety   . Depression   . GERD (gastroesophageal reflux disease)   . Hyperlipemia   . Hypertension     There are no problems to display for this patient.   Past Surgical History:  Procedure Laterality Date  . HERNIA REPAIR         No family history on file.  Social History   Tobacco Use  . Smoking status: Never Smoker  . Smokeless tobacco: Never Used  Substance Use Topics  . Alcohol use: Yes    Comment: weekly  . Drug use: No    Home Medications Prior to Admission medications   Medication Sig Start Date End Date Taking? Authorizing Provider  amLODipine (NORVASC) 5 MG tablet Take 1 tablet (5 mg total) by mouth daily for 7 days. 08/17/18 08/24/18  Fatima Blank, MD  Rivaroxaban 15 & 20 MG TBPK Take as directed on package: Start with one 15mg  tablet by mouth twice a day with food. On Day 22, switch to one 20mg  tablet once a day with food. 06/11/19   Drenda Freeze, MD    Allergies    Patient has no known allergies.  Review of Systems   Review of Systems  Constitutional: Negative for chills and fever.  HENT: Negative for rhinorrhea and sore throat.   Eyes: Negative for visual disturbance.  Respiratory: Negative for cough and shortness of breath.   Cardiovascular: Positive for chest pain. Negative for leg swelling.  Gastrointestinal: Negative for abdominal pain, diarrhea, nausea and vomiting.  Genitourinary: Negative for dysuria, frequency and urgency.  Musculoskeletal: Negative for joint swelling and neck pain.  Skin: Negative for rash and wound.  Neurological: Negative for syncope and numbness.  All other systems reviewed and are negative.   Physical Exam Updated Vital Signs BP (!) 141/115   Pulse 92   Temp 98.7 F (37.1 C) (Oral)   Resp (!) 21   Ht 5\' 8"  (1.727 m)   Wt 108.9 kg   SpO2 97%   BMI 36.49 kg/m   Physical Exam Vitals and nursing note reviewed.  Constitutional:      Appearance: He is  well-developed.  HENT:     Head: Normocephalic and atraumatic.  Eyes:     Conjunctiva/sclera: Conjunctivae normal.  Cardiovascular:     Rate and Rhythm: Regular rhythm. Tachycardia present.     Heart sounds: Normal heart sounds. No murmur.  Pulmonary:     Effort: Pulmonary effort is normal. No respiratory distress.     Breath sounds: Normal breath sounds. No wheezing or rales.  Abdominal:     General: Bowel sounds are normal. There is no distension.     Palpations: Abdomen is soft.     Tenderness: There is no abdominal tenderness.  Musculoskeletal:        General: No tenderness or deformity. Normal range of motion.     Cervical back: Neck supple.  Skin:    General: Skin is warm and dry.     Findings: No erythema or  rash.  Neurological:     Mental Status: He is alert and oriented to person, place, and time.  Psychiatric:        Behavior: Behavior normal.     ED Results / Procedures / Treatments   Labs (all labs ordered are listed, but only abnormal results are displayed) Labs Reviewed  BASIC METABOLIC PANEL - Abnormal; Notable for the following components:      Result Value   Glucose, Bld 147 (*)    All other components within normal limits  CBC WITH DIFFERENTIAL/PLATELET  BRAIN NATRIURETIC PEPTIDE  TROPONIN I (HIGH SENSITIVITY)    EKG EKG Interpretation  Date/Time:  Wednesday October 06 2019 09:29:27 EST Ventricular Rate:  94 PR Interval:    QRS Duration: 90 QT Interval:  386 QTC Calculation: 483 R Axis:   -52 Text Interpretation: Sinus rhythm Left anterior fascicular block Borderline prolonged QT interval No STEMI Confirmed by Alvester Chourifan, Matthew [phone]([id number]) on 10/06/2019 10:31:33 AM   Radiology CT Angio Chest PE W/Cm &/Or Wo Cm  Result Date: 10/06/2019 CLINICAL DATA:  Shortness of breath EXAM: CT ANGIOGRAPHY CHEST WITH CONTRAST TECHNIQUE: Multidetector CT imaging of the chest was performed using the standard protocol during bolus administration of intravenous contrast. Multiplanar CT image reconstructions and MIPs were obtained to evaluate the vascular anatomy. CONTRAST:  100mL OMNIPAQUE IOHEXOL 350 MG/ML SOLN COMPARISON:  None. FINDINGS: Cardiovascular: Heart is normal size. Aorta is normal caliber. No filling defects in the pulmonary arteries to suggest pulmonary emboli. Mediastinum/Nodes: No mediastinal, hilar, or axillary adenopathy. Trachea and esophagus are unremarkable. Thyroid unremarkable. Lungs/Pleura: Visualized lungs clear. Upper Abdomen: Diffuse low-density throughout the liver suggesting fatty infiltration. The liver appears enlarged although not imaged in its entirety. Musculoskeletal: Chest wall soft tissues are unremarkable. No acute bony abnormality. Review of the MIP images  confirms the above findings. IMPRESSION: No evidence of pulmonary embolus. No acute cardiopulmonary disease. Probable fatty infiltration of the liver. Concern for hepatomegaly. Recommend clinical correlation. Electronically Signed   By: Charlett NoseKevin  Dover M.D.   On: 10/06/2019 10:44    Procedures Procedures (including critical care time)  Medications Ordered in ED Medications  iohexol (OMNIPAQUE) 350 MG/ML injection 100 mL (100 mLs Intravenous Contrast Given 10/06/19 1027)    ED Course  I have reviewed the triage vital signs and the nursing notes.  Pertinent labs & imaging results that were available during my care of the patient were reviewed by me and considered in my medical decision making (see chart for details).    MDM Rules/Calculators/A&P  Presents for a scan of his chest to rule out acute clot.  He had a family member recently passed from a blood clot.  He has a history of DVT and recently was stopped on his Xarelto.  His physical exam is reassuring.  No peripheral edema or asymmetric leg swelling.  Lungs clear to auscultation throughout.  Abdomen soft nontender palpation.  His EKG with a sinus rhythm with no acute ST changes.  His blood work is reassuring with a normal white count, normal hemoglobin, normal kidney function.  His troponin and BNP are normal.  He received a CT angio of the chest which shows no evidence of PE.  CT does show fatty liver.  I discussed CT findings with patient and encouraged close follow-up with PCP.  I discussed work-up for possible hypercoagulable state given his family history.  Patient agreeable with this plan.  He states he is feeling much better.  After I told patient that he did not have a PE his blood pressure improved to 137/94.  Patient ready and stable for discharge.   At this time there does not appear to be any evidence of an acute emergency medical condition and the patient appears stable for discharge with appropriate outpatient  follow up.Diagnosis was discussed with patient who verbalizes understanding and is agreeable to discharge. Pt case discussed with Dr. Renaye Rakers who agrees with my plan.    Final Clinical Impression(s) / ED Diagnoses Final diagnoses:  None    Rx / DC Orders ED Discharge Orders    None       Rueben Bash 10/06/19 1806    Terald Sleeper, MD 10/06/19 [id number]

## 2019-10-06 NOTE — Discharge Instructions (Signed)
Please follow-up with your primary care provider for continued evaluation.  Your PCP  might want to do further testing to check for a hypercoagulable state given your family history of blood clots.  Your CT showed a fatty liver.  Please follow-up with your PCP regarding this.  Return to the emergency room immediately for new or worsening symptoms or concerns, such as chest pain, shortness of breath, fevers, vomiting or any concerns at all.

## 2019-10-06 NOTE — ED Notes (Signed)
Patient transported to CT 

## 2020-08-24 ENCOUNTER — Emergency Department (HOSPITAL_BASED_OUTPATIENT_CLINIC_OR_DEPARTMENT_OTHER)
Admission: EM | Admit: 2020-08-24 | Discharge: 2020-08-24 | Disposition: A | Discharge status: Home / Self Care | Payer: BLUE CROSS/BLUE SHIELD | Attending: Emergency Medicine | Admitting: Emergency Medicine

## 2020-08-24 ENCOUNTER — Emergency Department (HOSPITAL_BASED_OUTPATIENT_CLINIC_OR_DEPARTMENT_OTHER): Payer: BLUE CROSS/BLUE SHIELD

## 2020-08-24 ENCOUNTER — Other Ambulatory Visit: Payer: Self-pay

## 2020-08-24 ENCOUNTER — Encounter (HOSPITAL_BASED_OUTPATIENT_CLINIC_OR_DEPARTMENT_OTHER): Payer: Self-pay | Admitting: Emergency Medicine

## 2020-08-24 DIAGNOSIS — Z86718 Personal history of other venous thrombosis and embolism: Secondary | ICD-10-CM | POA: Insufficient documentation

## 2020-08-24 DIAGNOSIS — M79604 Pain in right leg: Secondary | ICD-10-CM | POA: Diagnosis present

## 2020-08-24 DIAGNOSIS — M79671 Pain in right foot: Secondary | ICD-10-CM

## 2020-08-24 DIAGNOSIS — I10 Essential (primary) hypertension: Secondary | ICD-10-CM | POA: Insufficient documentation

## 2020-08-24 DIAGNOSIS — Z79899 Other long term (current) drug therapy: Secondary | ICD-10-CM | POA: Diagnosis not present

## 2020-08-24 DIAGNOSIS — Z7901 Long term (current) use of anticoagulants: Secondary | ICD-10-CM | POA: Diagnosis not present

## 2020-08-24 HISTORY — DX: Acute embolism and thrombosis of unspecified deep veins of unspecified lower extremity: I82.409

## 2020-08-24 MED ORDER — KETOROLAC TROMETHAMINE 30 MG/ML IJ SOLN
15.0000 mg | Freq: Once | INTRAMUSCULAR | Status: AC
  Administered 2020-08-24: 15 mg via INTRAVENOUS
  Filled 2020-08-24: qty 1

## 2020-08-24 NOTE — Discharge Instructions (Signed)
You have been seen here for right foot pain.  Your DVT study was negative.  I recommend taking over-the-counter pain medications like ibuprofen and/or Tylenol every 6 as needed.  Please follow dosage and on the back of bottle.  I also recommend applying heat to the area and stretching out the muscles as this will help decrease stiffness and pain.    Please follow-up with your PCP for further evaluation management.  Come back to the emergency department if you develop chest pain, shortness of breath, severe abdominal pain, uncontrolled nausea, vomiting, diarrhea.

## 2020-08-24 NOTE — ED Provider Notes (Signed)
Patient received at shift change from Evelena Leyden, PA-C he provided HPI, current work-up, likely disposition, please see his note for full detail.  Per Octavio Manns plan will review DVT study, if negative and patient is doing well will discharge home.   Physical Exam  BP (!) 140/108 (BP Location: Left Arm)   Pulse (!) 101   Temp 99.1 F (37.3 C) (Oral)   Resp 18   Ht 5\' 9"  (1.753 m)   Wt 112.5 kg   SpO2 100%   BMI 36.62 kg/m   Physical Exam Vitals and nursing note reviewed.  Constitutional:      General: He is not in acute distress.    Appearance: Normal appearance. He is not ill-appearing.  HENT:     Head: Normocephalic and atraumatic.     Nose: No congestion.  Eyes:     Conjunctiva/sclera: Conjunctivae normal.  Cardiovascular:     Heart sounds: No murmur heard.  No friction rub. No gallop.   Pulmonary:     Effort: Pulmonary effort is normal. No respiratory distress.     Breath sounds: Normal breath sounds. No stridor. No wheezing or rales.  Musculoskeletal:        General: Tenderness present.     Cervical back: Neck supple.     Right lower leg: No edema.     Left lower leg: No edema.     Comments: Patient's right foot was visualized there is no edema, erythema, other gross abnormalities noted.  It was slightly tender to palpation along the anterior aspect of his metatarsals, no crepitus or deformities palpated.  He had full range of motion at his toes and ankle, neurovascular fully intact.  Skin:    General: Skin is warm and dry.     Coloration: Skin is not jaundiced or pale.     Comments: Skin exam was performed no rashes, erythematous, lacerations or abrasions, track marks noted on exam.  Neurological:     Mental Status: He is alert and oriented to person, place, and time.  Psychiatric:        Mood and Affect: Mood normal.     ED Course/Procedures     Procedures  MDM  Patient's DVT study was negative  Low suspicion for DVT as DVT study was negative.   No specimen for PE as patient has pleuritic chest pain, shortness of breath, lung sounds are clear bilaterally. I have low suspicion for septic arthritis as patient denies IV drug use, skin exam was performed no erythematous, edematous, warm joints noted on exam, no new heart murmur heard on exam.  Low suspicion for fracture or dislocation as patient denies recent trauma to the area, no deformities felt upon palpation.  Low suspicion for ligament or tendon damage as area was palpated no gross defects noted, he had full range of motion at his toes and ankle..  Low suspicion for compartment syndrome as area was palpated it was soft to the touch, neurovascular fully intact.  Suspect he may have a muscular strain.  Will have him follow-up with his PCP for further evaluation management.  Vital signs have remained stable, no indication for hospital admission.  Patient given at home care as well strict return precautions.  Patient verbalized that they understood agreed to said plan.         , PA-C 08/24/20 1910    13/11/21, DO 08/24/20 2009

## 2020-08-24 NOTE — ED Provider Notes (Signed)
[facility] EMERGENCY DEPARTMENT Provider Note   CSN: [id number] Arrival date & time: 08/24/20  1402     History Chief Complaint  Patient presents with  . Leg Pain    Jerome Dickerson is a 44 y.o. male with PMH of HTN and DVT 06/11/2019 who presents the ED with complaints of right leg pain.  I reviewed patient's medical record and his blood clot from 06/11/2019 was unprovoked.  On my examination, patient states that he had been followed by his primary care provider subsequent to discharge from the ER after he was diagnosed with DVT.  He was discontinued from his Xarelto shortly thereafter.  His brother then died right around Christmas of 2020 from a pulmonary embolism.  His anxiety then prompted him to return to the ED shortly thereafter and was assessed with a CTA of the chest and bilateral DVT studies which were all reassuring.  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  He denies any recent surgeries or immobilizations.  He does not take any blood thinning agents.  On 08/20/2020, he developed an aching sensation in his right leg spanning from his ankle to his hip.  He states that this feels relatively consistent to his previous blood clots and that is his primary concern here today.  He denies any precipitating injury or trauma.  He does endorse some worsening of his pain with dorsiflexion of his ankle.  He denies any other symptoms at this time.  Specifically, no fevers or chills, chest pain or shortness of breath, or other symptoms.  While he was noted to be mildly tachycardic to 102 here in triage, he states that he is simply anxious.  HPI     Past Medical History:  Diagnosis Date  . Anxiety   . Depression   . DVT (deep venous thrombosis) (HCC)   . GERD (gastroesophageal reflux disease)   . Hyperlipemia   . Hypertension     There are no problems to display for this patient.   Past Surgical History:  Procedure Laterality Date  . HERNIA REPAIR          No family history on file.  Social History   Tobacco Use  . Smoking status: Never Smoker  . Smokeless tobacco: Never Used  Vaping Use  . Vaping Use: Never used  Substance Use Topics  . Alcohol use: Yes    Comment: weekly  . Drug use: No    Home Medications Prior to Admission medications   Medication Sig Start Date End Date Taking? Authorizing Provider  amLODipine (NORVASC) 5 MG tablet Take 1 tablet (5 mg total) by mouth daily for 7 days. 08/17/18 08/24/18  Nira Conn, MD  Rivaroxaban 15 & 20 MG TBPK Take as directed on package: Start with one 15mg  tablet by mouth twice a day with food. On Day 22, switch to one 20mg  tablet once a day with food. 06/11/19   , MD    Allergies    Patient has no known allergies.  Review of Systems   Review of Systems  All other systems reviewed and are negative.   Physical Exam Updated Vital Signs BP (!) 145/101 (BP Location: Right Arm)   Pulse (!) 102   Temp 99.1 F (37.3 C) (Oral)   Resp 18   Ht 5\' 9"  (1.753 m)   Wt 112.5 kg   SpO2 98%   BMI 36.62 kg/m   Physical Exam Vitals and nursing note reviewed.  Exam conducted with a chaperone present.  Constitutional:      General: He is not in acute distress.    Appearance: Normal appearance. He is not ill-appearing.  HENT:     Head: Normocephalic and atraumatic.  Eyes:     General: No scleral icterus.    Conjunctiva/sclera: Conjunctivae normal.  Cardiovascular:     Rate and Rhythm: Normal rate and regular rhythm.     Pulses: Normal pulses.     Heart sounds: Normal heart sounds.  Pulmonary:     Effort: Pulmonary effort is normal. No respiratory distress.     Breath sounds: Normal breath sounds.  Musculoskeletal:     Comments: Right leg: Mild swelling and overlying erythema when compared to contralateral leg.  1+ pitting edema compared to no edema on left side.  Peripheral pulses intact and symmetric.  Sensation intact throughout.  Positive  Homans' sign and discomfort /discomfort with active dorsiflexion.  ROM of hip, knee, and ankle assessed and all intact.  Ambulatory, albeit with antalgia.  No focal areas of tenderness.  No overlying ecchymoses.  Skin:    General: Skin is dry.     Capillary Refill: Capillary refill takes less than 2 seconds.  Neurological:     Mental Status: He is alert and oriented to person, place, and time.     GCS: GCS eye subscore is 4. GCS verbal subscore is 5. GCS motor subscore is 6.  Psychiatric:        Mood and Affect: Mood normal.        Behavior: Behavior normal.        Thought Content: Thought content normal.     ED Results / Procedures / Treatments   Labs (all labs ordered are listed, but only abnormal results are displayed) Labs Reviewed - No data to display  EKG None  Radiology No results found.  Procedures Procedures (including critical care time)  Medications Ordered in ED Medications - No data to display  ED Course  I have reviewed the triage vital signs and the nursing notes.  Pertinent labs & imaging results that were available during my care of the patient were reviewed by me and considered in my medical decision making (see chart for details).    MDM Rules/Calculators/A&P                          We will obtain DVT study.  Do not feel as though plain films would yield any significant findings.  Patient denies any precipitating injury or trauma.  There is no significant warmth or induration otherwise concerning for a cellulitis picture.  ROM of his joints appear to be intact.  At shift change care was transferred to [facility], PA-C who will follow pending studies, re-evaluate, and determine disposition.     Final Clinical Impression(s) / ED Diagnoses Final diagnoses:  Right leg pain    Rx / DC Orders ED Discharge Orders    None       Lorelee New, PA-C 08/24/20 1755    Melene Plan, DO 08/24/20 2009

## 2020-08-24 NOTE — ED Triage Notes (Signed)
R leg pain from hip to ankle x 1 week. Hx of DVT. Denies injury

## 2021-06-14 ENCOUNTER — Other Ambulatory Visit (HOSPITAL_BASED_OUTPATIENT_CLINIC_OR_DEPARTMENT_OTHER): Payer: Self-pay

## 2021-06-14 ENCOUNTER — Emergency Department (HOSPITAL_BASED_OUTPATIENT_CLINIC_OR_DEPARTMENT_OTHER): Payer: BLUE CROSS/BLUE SHIELD

## 2021-06-14 ENCOUNTER — Emergency Department (HOSPITAL_BASED_OUTPATIENT_CLINIC_OR_DEPARTMENT_OTHER)
Admission: EM | Admit: 2021-06-14 | Discharge: 2021-06-14 | Disposition: A | Discharge status: Home / Self Care | Payer: BLUE CROSS/BLUE SHIELD | Attending: Emergency Medicine | Admitting: Emergency Medicine

## 2021-06-14 ENCOUNTER — Encounter (HOSPITAL_BASED_OUTPATIENT_CLINIC_OR_DEPARTMENT_OTHER): Payer: Self-pay | Admitting: *Deleted

## 2021-06-14 ENCOUNTER — Other Ambulatory Visit: Payer: Self-pay

## 2021-06-14 DIAGNOSIS — I82431 Acute embolism and thrombosis of right popliteal vein: Secondary | ICD-10-CM | POA: Diagnosis not present

## 2021-06-14 DIAGNOSIS — E119 Type 2 diabetes mellitus without complications: Secondary | ICD-10-CM | POA: Diagnosis not present

## 2021-06-14 DIAGNOSIS — M7989 Other specified soft tissue disorders: Secondary | ICD-10-CM

## 2021-06-14 DIAGNOSIS — Z7984 Long term (current) use of oral hypoglycemic drugs: Secondary | ICD-10-CM | POA: Insufficient documentation

## 2021-06-14 DIAGNOSIS — Z79899 Other long term (current) drug therapy: Secondary | ICD-10-CM | POA: Diagnosis not present

## 2021-06-14 DIAGNOSIS — I10 Essential (primary) hypertension: Secondary | ICD-10-CM | POA: Insufficient documentation

## 2021-06-14 DIAGNOSIS — I82531 Chronic embolism and thrombosis of right popliteal vein: Secondary | ICD-10-CM

## 2021-06-14 DIAGNOSIS — Z7901 Long term (current) use of anticoagulants: Secondary | ICD-10-CM | POA: Insufficient documentation

## 2021-06-14 DIAGNOSIS — Z794 Long term (current) use of insulin: Secondary | ICD-10-CM | POA: Diagnosis not present

## 2021-06-14 DIAGNOSIS — Z7982 Long term (current) use of aspirin: Secondary | ICD-10-CM | POA: Diagnosis not present

## 2021-06-14 DIAGNOSIS — R600 Localized edema: Secondary | ICD-10-CM | POA: Diagnosis present

## 2021-06-14 HISTORY — DX: Type 2 diabetes mellitus without complications: E11.9

## 2021-06-14 MED ORDER — RIVAROXABAN (XARELTO) VTE STARTER PACK (15 & 20 MG)
ORAL_TABLET | ORAL | 0 refills | Status: AC
  Filled 2021-06-14: qty 51, 30d supply, fill #0

## 2021-06-14 MED ORDER — RIVAROXABAN (XARELTO) EDUCATION KIT FOR DVT/PE PATIENTS: PACK | Freq: Once | Status: DC

## 2021-06-14 MED ORDER — RIVAROXABAN 15 MG PO TABS
15.0000 mg | ORAL_TABLET | Freq: Once | ORAL | Status: AC
  Administered 2021-06-14: 15 mg via ORAL
  Filled 2021-06-14: qty 1

## 2021-06-14 NOTE — Discharge Instructions (Addendum)
Your ultrasound today showed a chronic DVT of your right leg behind your knee.  As such, you were restarted on a blood thinner. Follow-up with your primary care doctor for further evaluation of this area and discussion as to how long you need to be on the blood thinner. Do not take medicine such as aspirin, ibuprofen, Advil.  If you are having pain, Tylenol is safe to take. Keep your leg elevated to help with pain and swelling. You may try compression socks to help with swelling. Return to the emergency room immediately if you did have a head injury, have blood in your urine or stool, if you have chest pain/shortness of breath, or with any new, worsening or concerning symptoms.  Information on my medicine - XARELTO (rivaroxaban)  This medication education was reviewed with me or my healthcare representative as part of my discharge preparation.   WHY WAS XARELTO PRESCRIBED FOR YOU? Xarelto was prescribed to treat blood clots that may have been found in the veins of your legs (deep vein thrombosis) or in your lungs (pulmonary embolism) and to reduce the risk of them occurring again.  What do you need to know about Xarelto? The starting dose is one 15 mg tablet taken TWICE daily with food for the FIRST 21 DAYS then the dose is changed to one 20 mg tablet taken ONCE A DAY with your evening meal.  DO NOT stop taking Xarelto without talking to the health care provider who prescribed the medication.  Refill your prescription for 20 mg tablets before you run out.  After discharge, you should have regular check-up appointments with your healthcare provider that is prescribing your Xarelto.  In the future your dose may need to be changed if your kidney function changes by a significant amount.  What do you do if you miss a dose? If you are taking Xarelto TWICE DAILY and you miss a dose, take it as soon as you remember. You may take two 15 mg tablets (total 30 mg) at the same time then resume your  regularly scheduled 15 mg twice daily the next day.  If you are taking Xarelto ONCE DAILY and you miss a dose, take it as soon as you remember on the same day then continue your regularly scheduled once daily regimen the next day. Do not take two doses of Xarelto at the same time.   Important Safety Information Xarelto is a blood thinner medicine that can cause bleeding. You should call your healthcare provider right away if you experience any of the following: Bleeding from an injury or your nose that does not stop. Unusual colored urine (red or dark brown) or unusual colored stools (red or black). Unusual bruising for unknown reasons. A serious fall or if you hit your head (even if there is no bleeding).  Some medicines may interact with Xarelto and might increase your risk of bleeding while on Xarelto. To help avoid this, consult your healthcare provider or pharmacist prior to using any new prescription or non-prescription medications, including herbals, vitamins, non-steroidal anti-inflammatory drugs (NSAIDs) and supplements.  This website has more information on Xarelto: VisitDestination.com.br.

## 2021-06-14 NOTE — ED Provider Notes (Signed)
[facility] EMERGENCY DEPARTMENT Provider Note   CSN: [id number] Arrival date & time: 06/14/21  1128     History Chief Complaint  Patient presents with   Leg Pain    Jerome Dickerson is a 45 y.o. male patient presenting for evaluation of right foot and lower leg pain and swelling.    Symptoms began 4 days ago.  They have continued to progress.  Pain is worse when he is walking.  It began on the dorsal lateral aspect of the right foot.  He has not taken anything for this including Tylenol ibuprofen.  He has a history of previous DVT about 2 years ago, was on a blood thinner but this was discontinued by his primary doctor.  He does not know if that DVT was provoked, but states his brother had a fatal PE a year ago.  He reports a history of hypertension and diabetes for which he takes medication, had blood work with his PCP a week ago which was reassuring.  He denies trauma, injury, precipitating event.  He states he is on his feet a lot for work.  No symptoms on the left side.  He denies fever, cough, chest pain, shortness of breath, palpitations, dizziness, lightheadedness.  HPI     Past Medical History:  Diagnosis Date   Anxiety    Depression    Diabetes mellitus without complication ([place])    DVT (deep venous thrombosis) (HCC)    GERD (gastroesophageal reflux disease)    Hyperlipemia    Hypertension     There are no problems to display for this patient.   Past Surgical History:  Procedure Laterality Date   HERNIA REPAIR         No family history on file.  Social History   Tobacco Use   Smoking status: Never   Smokeless tobacco: Never  Vaping Use   Vaping Use: Never used  Substance Use Topics   Alcohol use: Not Currently    Comment: weekly   Drug use: No    Home Medications Prior to Admission medications   Medication Sig Start Date End Date Taking? Authorizing Provider  amLODipine (NORVASC) 5 MG tablet Take by mouth. 06/08/21  Yes [provider]  aspirin EC 81 MG tablet Take 81 mg by mouth daily. Swallow whole.   Yes [provider]  atorvastatin (LIPITOR) 20 MG tablet Take 2 tablets by mouth daily. 06/08/21  Yes [provider]  escitalopram (LEXAPRO) 10 MG tablet Take 1 tablet by mouth daily. 06/08/21  Yes [provider]  hydrOXYzine (ATARAX/VISTARIL) 25 MG tablet Take 1 tablet by mouth 3 (three) times daily as needed. 06/08/21  Yes [provider]  insulin NPH Human (NOVOLIN N) 100 UNIT/ML injection Inject into the skin. 06/08/21  Yes [provider]  metFORMIN (GLUCOPHAGE) 1000 MG tablet Take by mouth. 06/11/21  Yes [provider]  RIVAROXABAN (XARELTO) VTE STARTER PACK (15 & 20 MG) Follow package directions: Take one 59m tablet by mouth twice a day. On day 22, switch to one 279mtablet once a day. Take with food. 06/14/21  Yes Temitope Griffing, PA-C  amLODipine (NORVASC) 5 MG tablet Take 1 tablet (5 mg total) by mouth daily for 7 days. 08/17/18 08/24/18  CaFatima BlankMD    Allergies    Patient has no known allergies.  Review of Systems   Review of Systems  Musculoskeletal:  Positive for arthralgias, joint swelling and myalgias.  All other systems reviewed and  are negative.  Physical Exam Updated Vital Signs BP (!) 154/108   Pulse 98   Temp 100 F (37.8 C) (Oral)   Resp (!) 26   Ht '5\' 9"'  (1.753 m)   Wt 113.3 kg   SpO2 98%   BMI 36.89 kg/m   Physical Exam Vitals and nursing note reviewed.  Constitutional:      General: He is not in acute distress.    Appearance: Normal appearance.     Comments: Resting in the bed in NAD  HENT:     Head: Normocephalic and atraumatic.  Eyes:     Conjunctiva/sclera: Conjunctivae normal.     Pupils: Pupils are equal, round, and reactive to light.  Cardiovascular:     Rate and Rhythm: Normal rate and regular rhythm.     Pulses: Normal pulses.  Pulmonary:     Effort: Pulmonary effort is normal. No  respiratory distress.     Breath sounds: Normal breath sounds. No wheezing.     Comments: Speaking in full sentences.  Clear lung sounds in all fields. Abdominal:     General: There is no distension.     Palpations: Abdomen is soft. There is no mass.     Tenderness: There is no abdominal tenderness. There is no guarding or rebound.  Musculoskeletal:        General: Swelling and tenderness present.     Cervical back: Normal range of motion and neck supple.     Comments: Tenderness palpation over the fifth metatarsal of the right foot.  Right foot and lower leg is swollen when compared to the left.  Mildly warm when compared to the left, however no erythema or induration.  No wound or signs of injury.  Mild tenderness palpation of the right calf.  Swelling and pain does not extend proximal to the knee  Skin:    General: Skin is warm and dry.     Capillary Refill: Capillary refill takes less than 2 seconds.  Neurological:     Mental Status: He is alert and oriented to person, place, and time.  Psychiatric:        Mood and Affect: Mood and affect normal.        Speech: Speech normal.        Behavior: Behavior normal.    ED Results / Procedures / Treatments   Labs (all labs ordered are listed, but only abnormal results are displayed) Labs Reviewed - No data to display  EKG None  Radiology US Venous Img Lower Right (DVT Study)  Result Date: 06/14/2021 CLINICAL DATA:  Right foot pain EXAM: Right LOWER EXTREMITY VENOUS DOPPLER ULTRASOUND TECHNIQUE: Gray-scale sonography with graded compression, as well as color Doppler and duplex ultrasound were performed to evaluate the lower extremity deep venous systems from the level of the common femoral vein and including the common femoral, femoral, profunda femoral, popliteal and calf veins including the posterior tibial, peroneal and gastrocnemius veins when visible. The superficial great saphenous vein was also interrogated. Spectral Doppler was  utilized to evaluate flow at rest and with distal augmentation maneuvers in the common femoral, femoral and popliteal veins. COMPARISON:  None. FINDINGS: Contralateral Common Femoral Vein: Respiratory phasicity is normal and symmetric with the symptomatic side. No evidence of thrombus. Normal compressibility. Common Femoral Vein: No evidence of thrombus. Normal compressibility, respiratory phasicity and response to augmentation. Saphenofemoral Junction: No evidence of thrombus. Normal compressibility and flow on color Doppler imaging. Profunda Femoral Vein: No evidence of thrombus. Normal compressibility  and flow on color Doppler imaging. Femoral Vein: No evidence of thrombus. Normal compressibility, respiratory phasicity and response to augmentation. Popliteal Vein: Mural thickening of the popliteal vein. Appearance most consistent with chronic DVT in the popliteal vein. Calf Veins: No evidence of thrombus. Normal compressibility and flow on color Doppler imaging. Superficial Great Saphenous Vein: No evidence of thrombus. Normal compressibility. Venous Reflux:  None. Other Findings:  None. IMPRESSION: Wall thickening in the popliteal vein likely due to mild chronic thrombus. Remainder of the deep venous system widely patent. Electronically Signed   By: Franchot Gallo M.D.   On: 06/14/2021 12:40   DG Foot Complete Right  Result Date: 06/14/2021 CLINICAL DATA:  Right foot pain EXAM: RIGHT FOOT COMPLETE - 3+ VIEW COMPARISON:  06/11/2019 FINDINGS: Mild hallux valgus. No fracture or arthropathy identified. No cause for acute pain. IMPRESSION: Mild hallux valgus. Electronically Signed   By: Franchot Gallo M.D.   On: 06/14/2021 12:37    Procedures Procedures   Medications Ordered in ED Medications  Rivaroxaban (XARELTO) tablet 15 mg (has no administration in time range)  rivaroxaban (XARELTO) Education Kit for DVT/PE patients (has no administration in time range)    ED Course  I have reviewed the triage  vital signs and the nursing notes.  Pertinent labs & imaging results that were available during my care of the patient were reviewed by me and considered in my medical decision making (see chart for details).    MDM Rules/Calculators/A&P                           Patient presenting for evaluation of right foot and leg pain and swelling.  On exam, patient appears nontoxic.  Swelling of the right lower extremity noted, area is warm but not erythematous or indurated.  Tenderness palpation over the fifth metatarsal.  Consider stress fracture versus tendinitis versus DVT versus cellulitis.  Will obtain x-ray and ultrasound.  X-ray viewed and independently interpreted by me, no fracture or dislocation.  Ultrasound shows a chronic DVT of the popliteal vein on the right side.  This was not present per ultrasound on 08-24-2020.  As patient has had a DVT before, and now has a reported chronic DVT, will restart him on blood thinners.  He will need to follow-up with his primary care doctor to determine how long to be on this and if he needs repeat ultrasound to verify whether the DVT is there.  Discussed findings with patient and plan, and he is agreeable.  Discussed return precautions.  Patient had blood work on 8-19 with his PCP, kidney function was good.  No anemia or thrombocytopenia.  I will not repeat today.  At this time, patient appears safe for discharge.  Return precautions given.  Patient states he understands and agrees to plan.   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

## 2021-06-14 NOTE — ED Triage Notes (Signed)
Right ankle pain and swelling x 2 days. No injury. His lower leg looks swollen. Hx of DVT. He has been off his blood thinner for a year.

## 2022-07-19 ENCOUNTER — Other Ambulatory Visit (HOSPITAL_COMMUNITY): Payer: Self-pay

## 2022-07-23 ENCOUNTER — Emergency Department (HOSPITAL_BASED_OUTPATIENT_CLINIC_OR_DEPARTMENT_OTHER)
Admission: EM | Admit: 2022-07-23 | Discharge: 2022-07-23 | Disposition: A | Discharge status: Home / Self Care | Payer: BLUE CROSS/BLUE SHIELD | Attending: Emergency Medicine | Admitting: Emergency Medicine

## 2022-07-23 ENCOUNTER — Other Ambulatory Visit: Payer: Self-pay

## 2022-07-23 ENCOUNTER — Emergency Department (HOSPITAL_BASED_OUTPATIENT_CLINIC_OR_DEPARTMENT_OTHER): Payer: BLUE CROSS/BLUE SHIELD

## 2022-07-23 ENCOUNTER — Encounter (HOSPITAL_BASED_OUTPATIENT_CLINIC_OR_DEPARTMENT_OTHER): Payer: Self-pay

## 2022-07-23 DIAGNOSIS — Z79899 Other long term (current) drug therapy: Secondary | ICD-10-CM | POA: Insufficient documentation

## 2022-07-23 DIAGNOSIS — M25572 Pain in left ankle and joints of left foot: Secondary | ICD-10-CM | POA: Diagnosis present

## 2022-07-23 DIAGNOSIS — M65272 Calcific tendinitis, left ankle and foot: Secondary | ICD-10-CM | POA: Diagnosis not present

## 2022-07-23 DIAGNOSIS — I10 Essential (primary) hypertension: Secondary | ICD-10-CM | POA: Insufficient documentation

## 2022-07-23 DIAGNOSIS — Z7982 Long term (current) use of aspirin: Secondary | ICD-10-CM | POA: Diagnosis not present

## 2022-07-23 DIAGNOSIS — M7752 Other enthesopathy of left foot: Secondary | ICD-10-CM

## 2022-07-23 MED ORDER — DICLOFENAC SODIUM 1 % EX GEL: 4.0000 g | Freq: Four times a day (QID) | CUTANEOUS | 0 refills | Status: AC

## 2022-07-23 NOTE — Discharge Instructions (Signed)
Contact a health care provider if: Your symptoms do not improve. You develop new, unexplained problems, such as numbness in your hands or feet.

## 2022-07-23 NOTE — ED Provider Notes (Signed)
[facility] EMERGENCY DEPARTMENT Provider Note   CSN: [id number] Arrival date & time: 07/23/22  6553     History  Chief Complaint  Patient presents with   Foot Pain    Jerome Dickerson is a 46 y.o. male left ankle pain which developed this morning.  Patient states that when he woke up this morning he noticed his left ankle was painful.  He has been able to ambulate.  Pain is in the ankle and is worse with flexion and extension.  He complains of pain across the dorsum of his foot.  He drinks about 3,32 ounce beers daily denies a personal history of gout but states his father did have gout.  He has a history of hypertension and prediabetes.  He is being followed by a primary care doctor and states that he is working on these conditions he has lost a significant amount of weight.  He denies any new shoes.  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  Fevers or chills.  He has no known injuries to the foot   Foot Pain       Home Medications Prior to Admission medications   Medication Sig Start Date End Date Taking? Authorizing Provider  atorvastatin (LIPITOR) 40 MG tablet Take 1 tablet by mouth daily. 06/18/22  Yes [provider]  blood glucose meter kit and supplies 1 each by Other route as needed for other. 09/29/20  Yes [provider]  diclofenac Sodium (VOLTAREN ARTHRITIS PAIN) 1 % GEL Apply 4 g topically 4 (four) times daily. 07/23/22  Yes Kambry Takacs, PA-C  empagliflozin (JARDIANCE) 25 MG TABS tablet Take 1 tablet by mouth daily. 06/18/22  Yes [provider]  Lancets (FREESTYLE) lancets 1 each by Other route as needed. 09/29/20  Yes [provider]  Vitamin D, Ergocalciferol, (DRISDOL) 1.25 MG (50000 UNIT) CAPS capsule Take 50,000 Units by mouth once a week. 05/31/22  Yes [provider]  amLODipine (NORVASC) 5 MG tablet Take by mouth. 06/08/21   [provider]  aspirin EC 81 MG  tablet Take 81 mg by mouth daily. Swallow whole.    [provider]  escitalopram (LEXAPRO) 10 MG tablet Take 1 tablet by mouth daily. 06/08/21   [provider]  hydrOXYzine (ATARAX/VISTARIL) 25 MG tablet Take 1 tablet by mouth 3 (three) times daily as needed. 06/08/21   [provider]  insulin NPH Human (NOVOLIN N) 100 UNIT/ML injection Inject into the skin. 06/08/21   [provider]  metFORMIN (GLUCOPHAGE) 1000 MG tablet Take by mouth. 06/11/21   [provider]  RIVAROXABAN Alveda Reasons) VTE STARTER PACK (15 & 20 MG) Follow package directions: Take one 53m tablet by mouth twice a day. On day 22, switch to one 255mtablet once a day. Take with food. 06/14/21   Caccavale, Sophia, PA-C      Allergies    Patient has no known allergies.    Review of Systems   Review of Systems  Physical Exam Updated Vital Signs BP (!) 155/115 (BP Location: Right Arm)   Pulse 94   Temp 98.8 F (37.1 C) (Oral)   Resp 18   Ht '5\' 9"'  (1.753 m)   Wt 99.8 kg   SpO2 98%   BMI 32.49 kg/m  Physical Exam Vitals and nursing note reviewed.  Constitutional:      General: He is not in acute distress.    Appearance: He is well-developed. He is not  diaphoretic.  HENT:     Head: Normocephalic and atraumatic.  Eyes:     General: No scleral icterus.    Conjunctiva/sclera: Conjunctivae normal.  Cardiovascular:     Rate and Rhythm: Normal rate and regular rhythm.     Heart sounds: Normal heart sounds.  Pulmonary:     Effort: Pulmonary effort is normal. No respiratory distress.     Breath sounds: Normal breath sounds.  Abdominal:     Palpations: Abdomen is soft.     Tenderness: There is no abdominal tenderness.  Musculoskeletal:     Cervical back: Normal range of motion and neck supple.     Comments: Tenderness over the plantar flexors of the left ankle.  Tenderness around the left ankle, no significant redness, heat or significant swelling.  He has full range of motion  but has pain in the dorsum of the foot with flexion and pain in the dorsum of the foot with extension.  DP and PT pulse 2+.  Normal sensation.  Skin:    General: Skin is warm and dry.  Neurological:     Mental Status: He is alert.  Psychiatric:        Behavior: Behavior normal.     ED Results / Procedures / Treatments   Labs (all labs ordered are listed, but only abnormal results are displayed) Labs Reviewed - No data to display  EKG None  Radiology DG Ankle Complete Left  Result Date: 07/23/2022 CLINICAL DATA:  Lateral left ankle pain. EXAM: LEFT ANKLE COMPLETE - 3+ VIEW COMPARISON:  None Available. FINDINGS: Mild distal medial malleolar degenerative osteophytosis. Small plantar calcaneal heel spur. No acute fracture or dislocation. Possible minimal lateral malleolar soft tissue swelling. IMPRESSION: 1. Mild distal medial malleolar degenerative spurring. 2. Small plantar calcaneal heel spur. 3. No acute fracture is seen. Electronically Signed   By: Yvonne Kendall M.D.   On: 07/23/2022 10:42    Procedures Procedures    Medications Ordered in ED Medications - No data to display  ED Course/ Medical Decision Making/ A&P                           Medical Decision Making Patient with left foot ankle pain.  Based on physical examination I suspect the patient has tendinitis of the plantar flexors of the left foot and ankle.  He has no evidence of infection, no heat, redness or warmth suggestive of septic joint or gout.  He is ambulatory.  Plan is to discharge with rice therapy, anti-inflammatories, ASO splint and outpatient follow-up.  Amount and/or Complexity of Data Reviewed Radiology: ordered and independent interpretation performed.    Details: Visualized and noted left ankle x-ray which shows some mild degenerative changes, no acute findings, no evidence of effusion in the joint.      Final Clinical Impression(s) / ED Diagnoses Final diagnoses:  Left ankle tendinitis     Rx / DC Orders ED Discharge Orders          Ordered    diclofenac Sodium (VOLTAREN ARTHRITIS PAIN) 1 % GEL  4 times daily        07/23/22 1106              Margarita Mail, PA-C 07/23/22 Dukes, [place], DO 07/23/22 1156

## 2022-07-23 NOTE — ED Triage Notes (Signed)
C/o left foot pain since waking up this morning. Denies known injury. Hx of blood clots.

## 2022-07-29 ENCOUNTER — Ambulatory Visit: Payer: BLUE CROSS/BLUE SHIELD | Admitting: Family Medicine

## 2023-01-28 ENCOUNTER — Encounter: Payer: Self-pay | Admitting: *Deleted

## 2023-06-30 IMAGING — CR DG FOOT COMPLETE 3+V*R*
3 series · 3 of 3 positions shown · non-contrast
Comparison: 06/11/2019

CLINICAL DATA: Right foot pain

EXAM:
RIGHT FOOT COMPLETE - 3+ VIEW

[t foot ap right *]
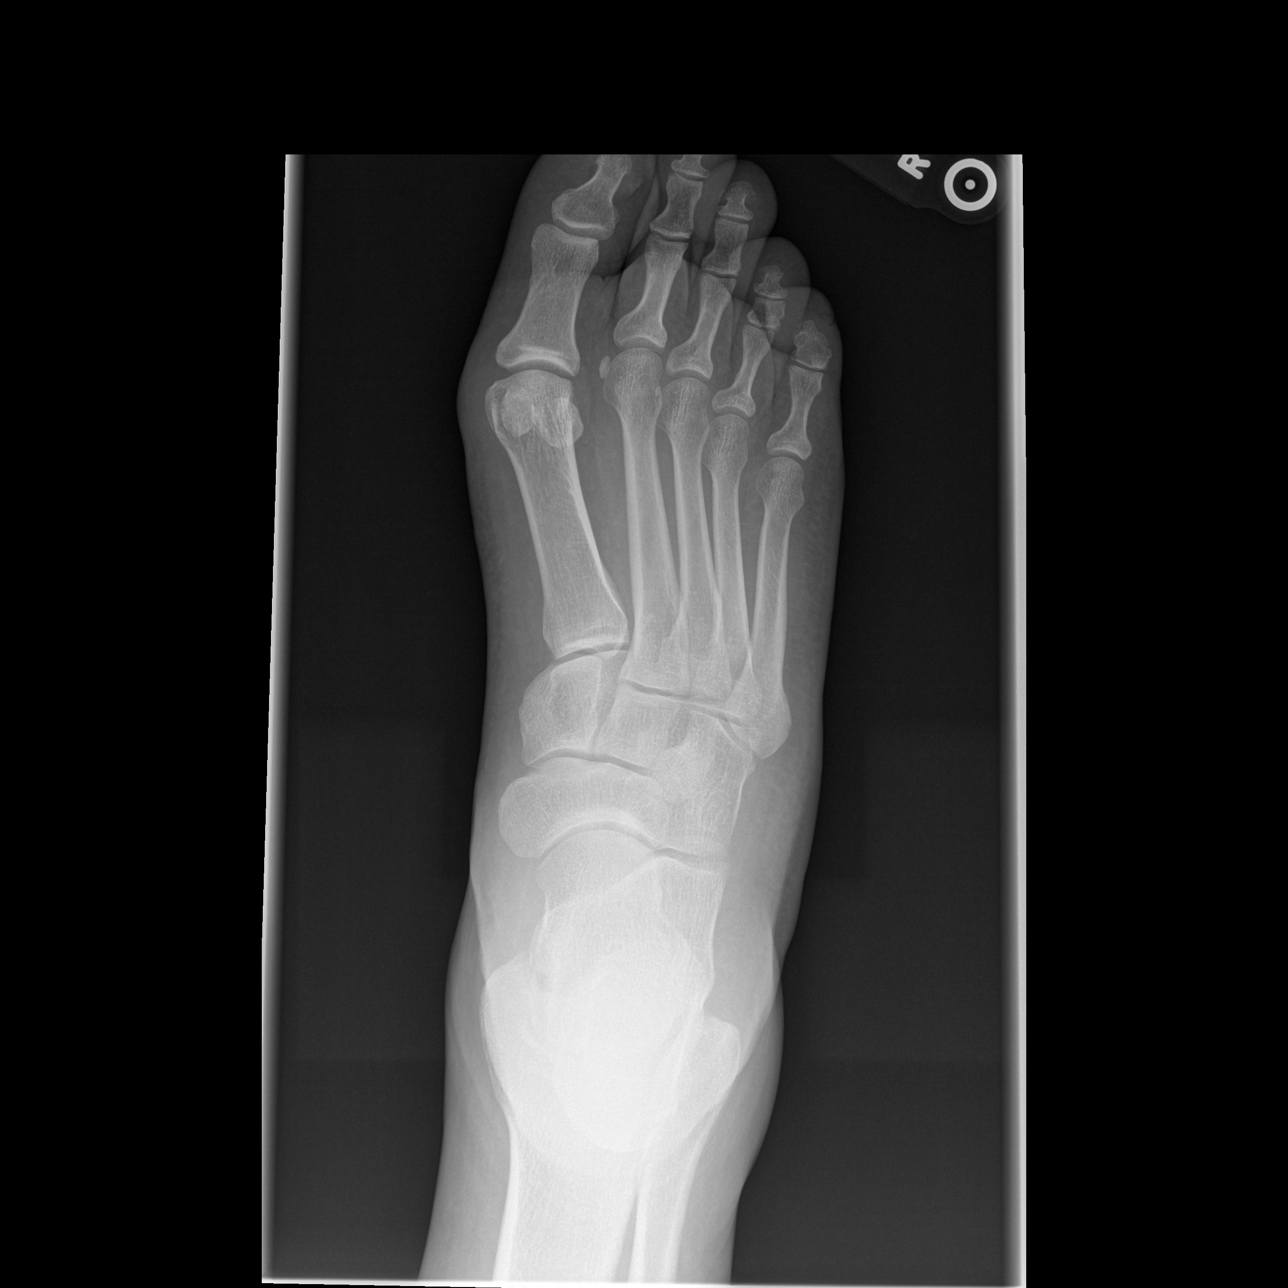

[t foot oblique right *]
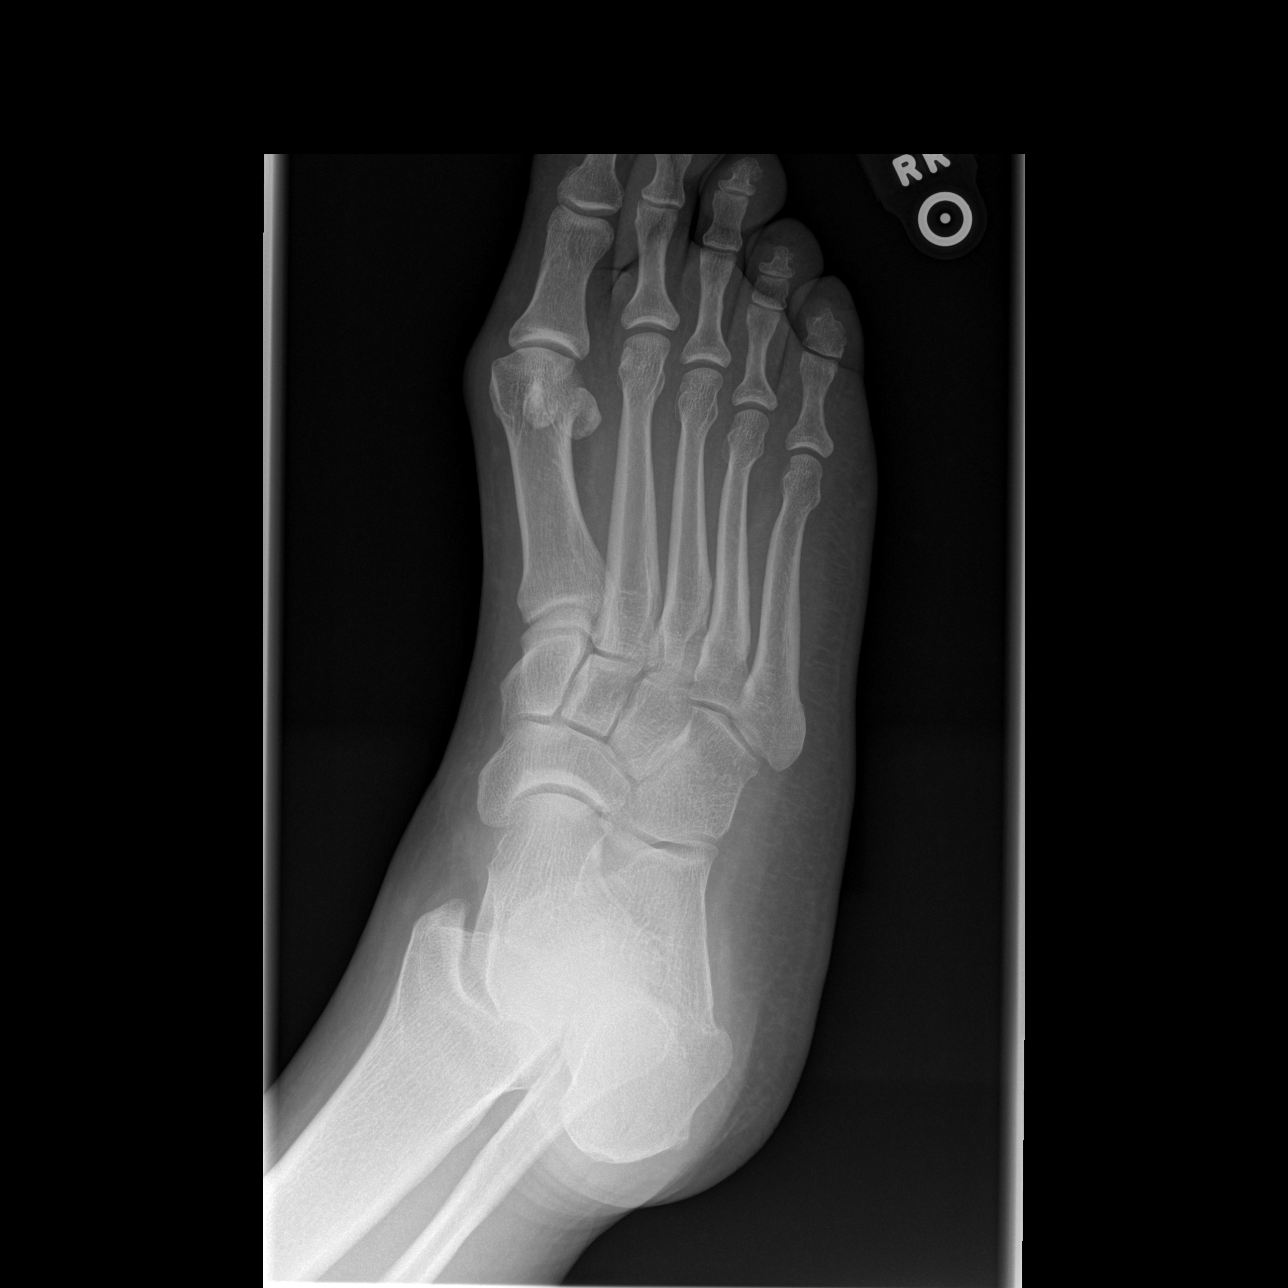

[t foot lat right *]
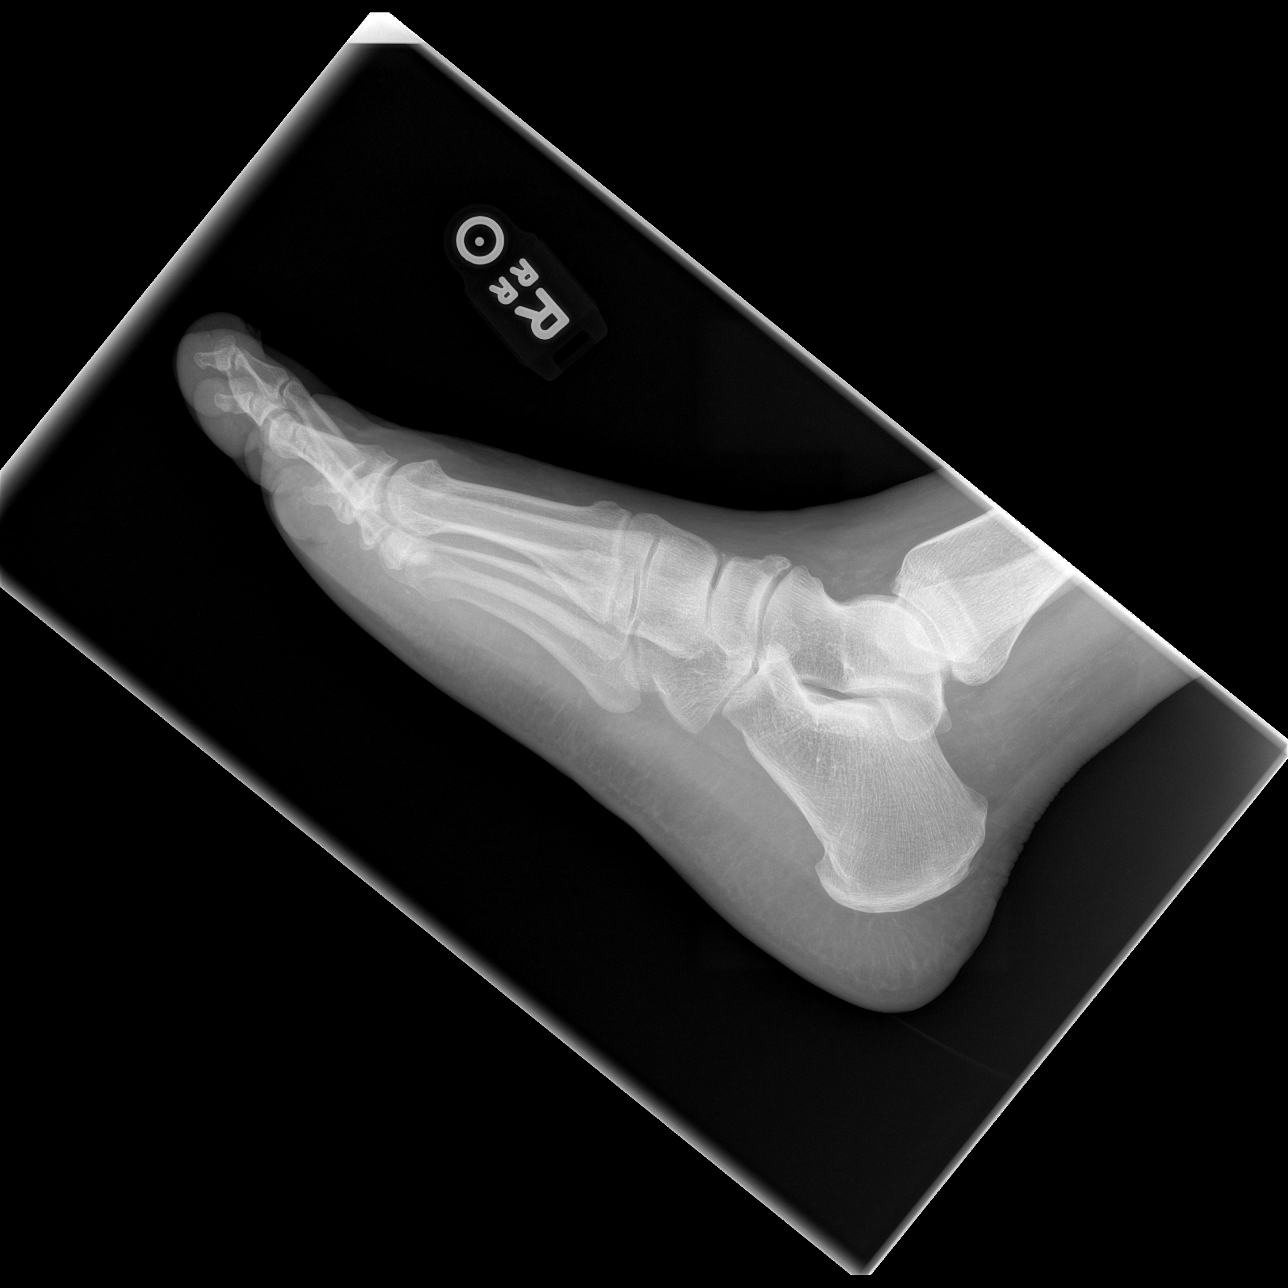

[3 of 3 positions shown; findings below may reference images not displayed]

FINDINGS: Mild hallux valgus. No fracture or arthropathy identified. No cause
for acute pain.
IMPRESSION: Mild hallux valgus.

## 2023-06-30 IMAGING — US US EXTREM LOW VENOUS*R*
1 series · 13 of 24 positions shown · non-contrast
Comparison: None.

CLINICAL DATA: Right foot pain



[Series 1: us extrem low venous*right* · 13 of 41 slices shown]
[im 1/41]
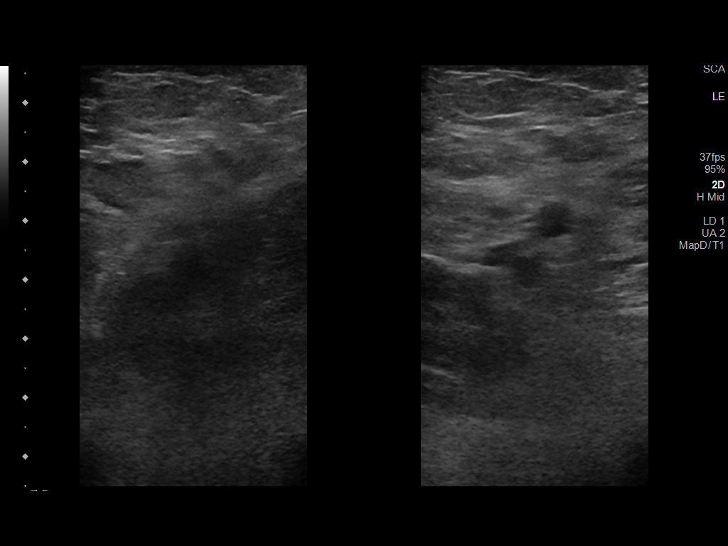
[im 4/41]
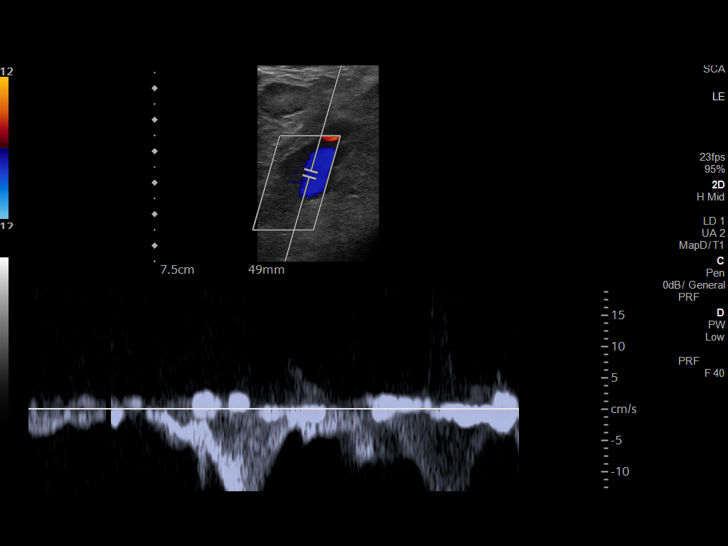
[im 7/41]
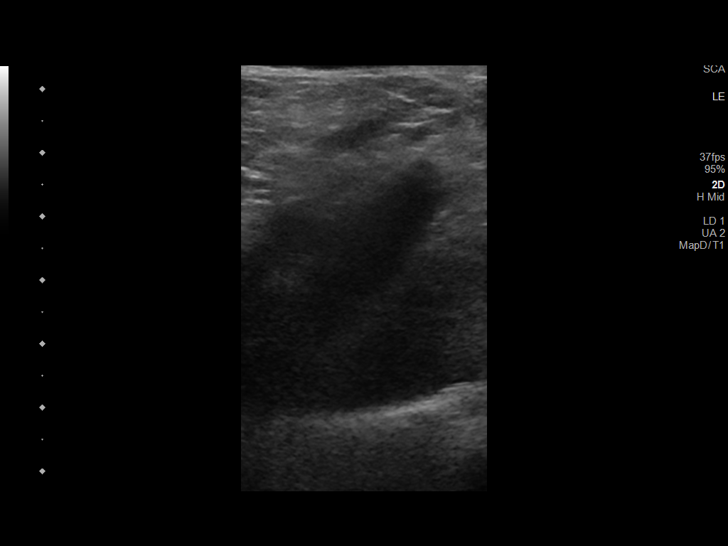
[im 11/41]
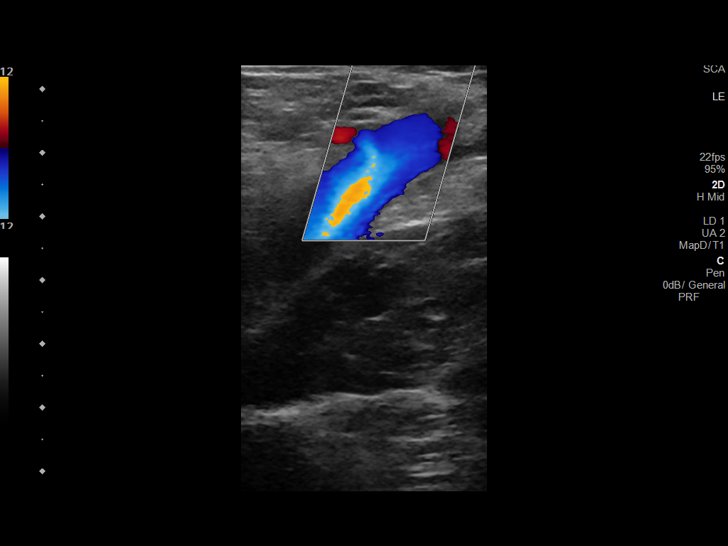
[im 14/41]
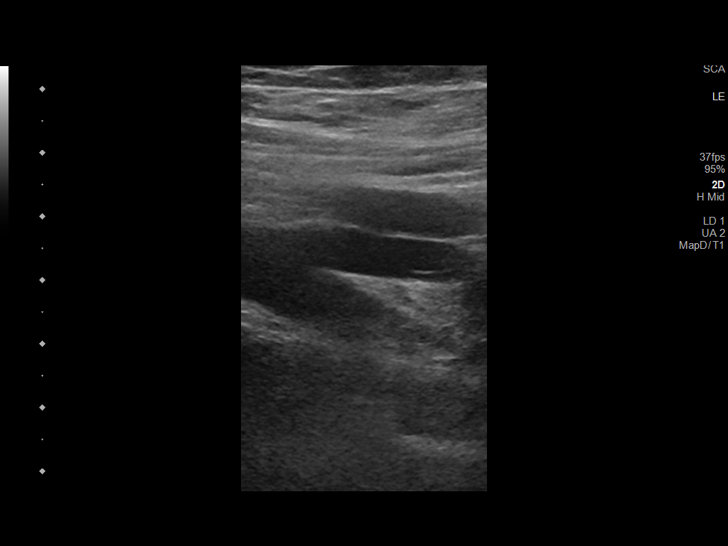
[im 18/41]
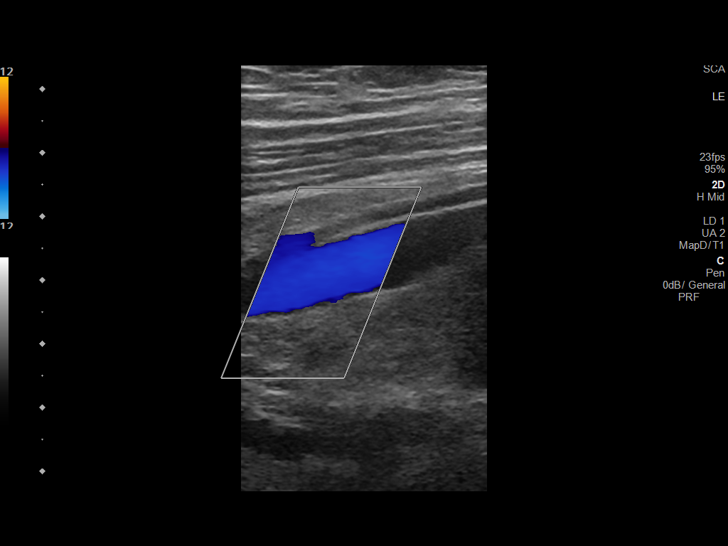
[im 21/41]
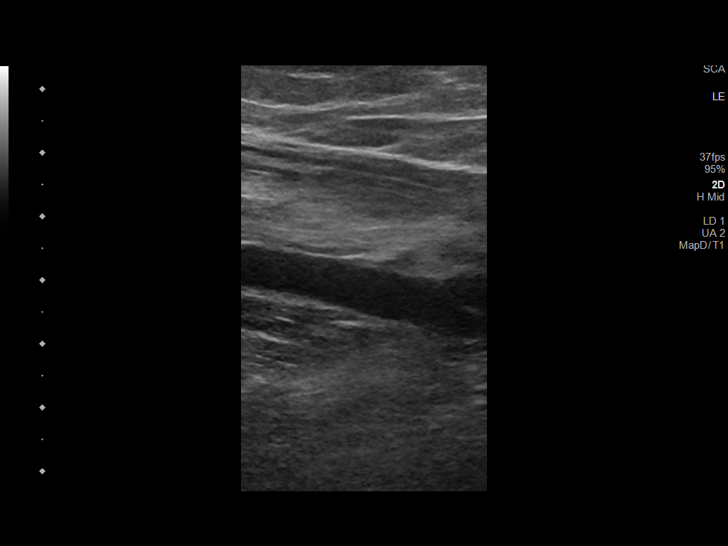
[im 23/41]
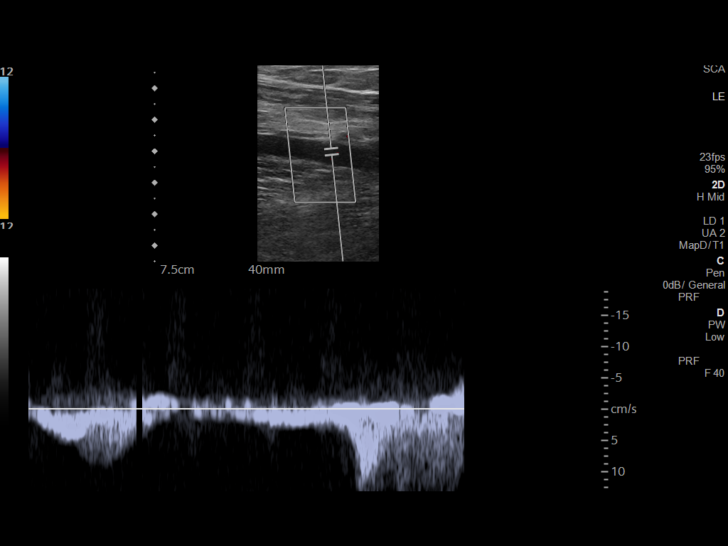
[im 27/41]
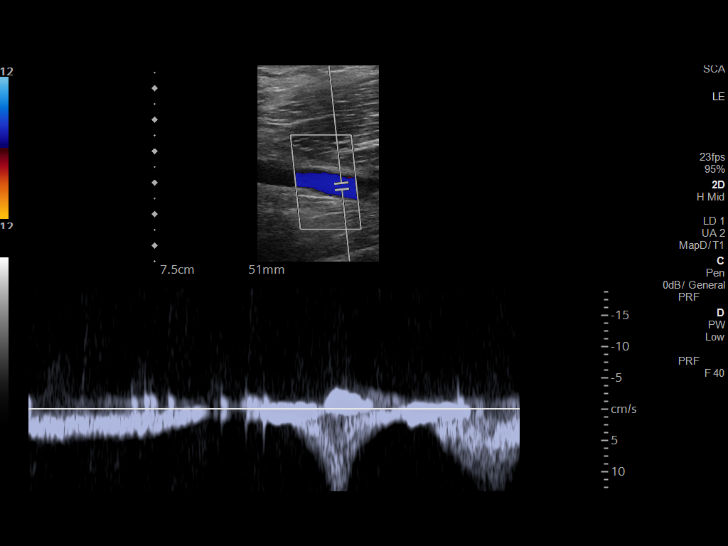
[im 30/41]
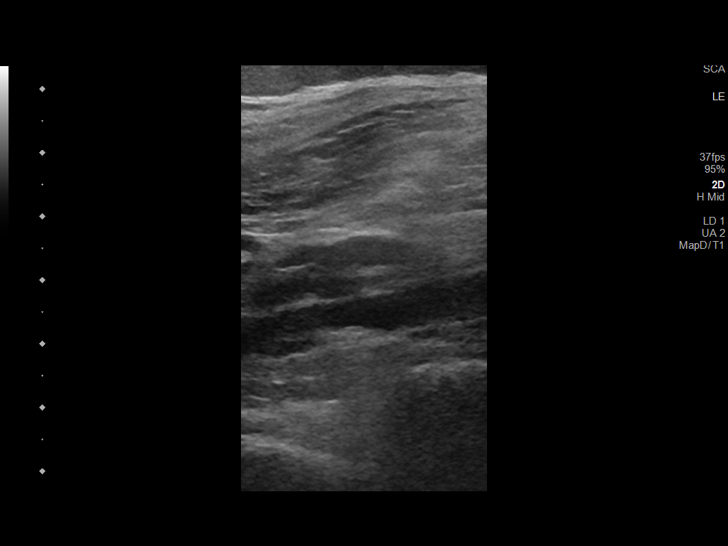
[im 34/41]
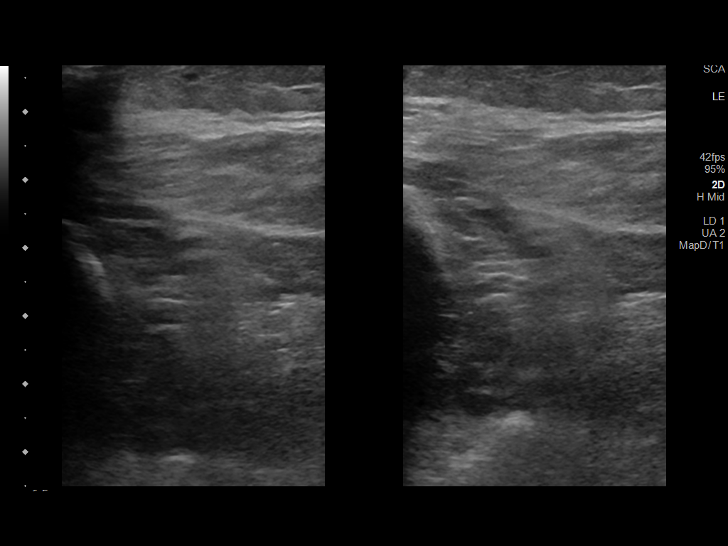
[im 37/41]
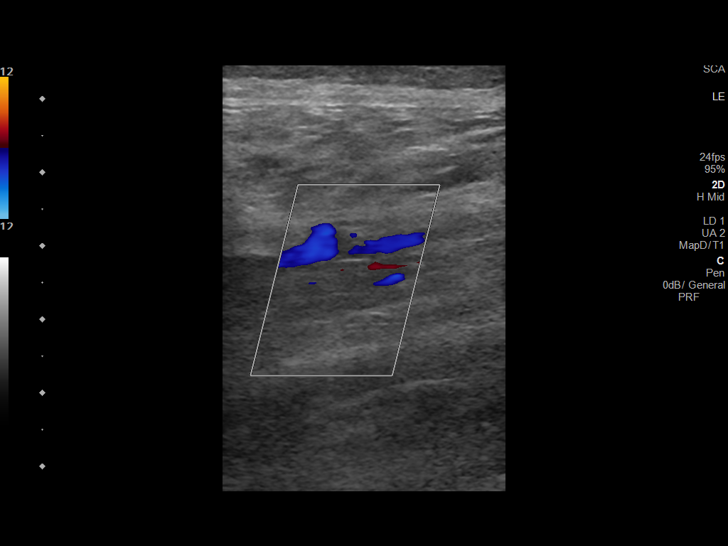
[im 41/41]
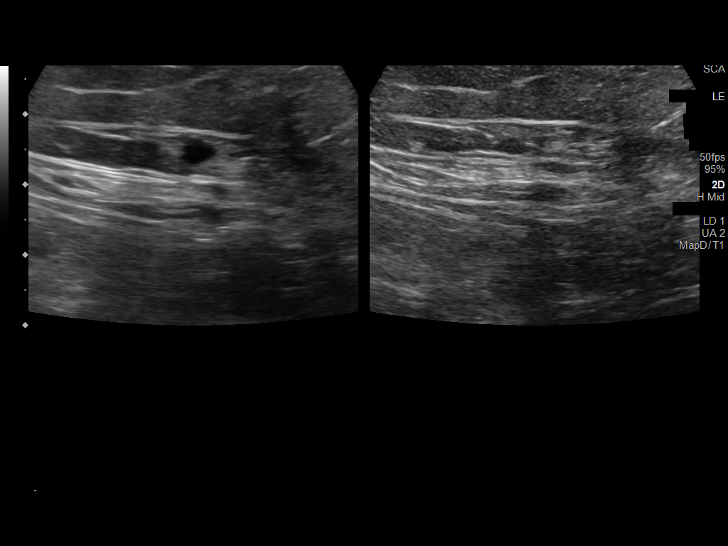

[13 of 24 positions shown; findings below may reference images not displayed]

FINDINGS: Contralateral Common Femoral Vein: Respiratory phasicity is normal
and symmetric with the symptomatic side. No evidence of thrombus.
Normal compressibility.

Common Femoral Vein: No evidence of thrombus. Normal
compressibility, respiratory phasicity and response to augmentation.

Saphenofemoral Junction: No evidence of thrombus. Normal
compressibility and flow on color Doppler imaging.

Profunda Femoral Vein: No evidence of thrombus. Normal
compressibility and flow on color Doppler imaging.

Femoral Vein: No evidence of thrombus. Normal compressibility,
respiratory phasicity and response to augmentation.

Popliteal Vein: Mural thickening of the popliteal vein. Appearance
most consistent with chronic DVT in the popliteal vein.

Calf Veins: No evidence of thrombus. Normal compressibility and flow
on color Doppler imaging.

Superficial Great Saphenous Vein: No evidence of thrombus. Normal
compressibility.

Venous Reflux:  None.

Other Findings:  None.
IMPRESSION: Wall thickening in the popliteal vein likely due to mild chronic
thrombus. Remainder of the deep venous system widely patent.

## 2023-10-07 NOTE — Progress Notes (Signed)
 Patient presents for  Chief Complaint  Patient presents with  . Follow-up    Management DM, LIPID, HTN   Labs not done  SUBJECTIVE   Hyperlipidemia Presents in follow up of his cholesterol.  Currently on Atorvastatin 40 mg every day Ro Taking med daily as rx. Tolerating medication well. No side effects.  Denies chest pain, SOB, myalgias, claudication, vision loss.  The ASCVD Risk score (Arnett DK, et al., 2019) failed to calculate for the following reasons:   The valid total cholesterol range is 130 to 320 mg/dL Lab Results  Component Value Date   CHOL 107 01/31/2023   TRIG 199 (H) 01/31/2023   HDL 47 (L) 01/31/2023     Hypertension Patient presents for hypertension follow up.  Currently on Amlodipine  10 mg every day. Takes meds daily as rx. Does not check home BP. Denies chest pain, SOB, DOE, orthopnea, edema. BP Readings from Last 3 Encounters:  10/10/23 (!) 168/116  02/17/23 (!) 151/99  11/18/22 124/80     Diabetes Presents in follow up of his diabetes.  Medications: Metformin  1000 mg every day. Not taking ozempic  Insulin NPH 100 unit/ml Lab Results  Component Value Date   HGBA1C 6.0 (H) 01/31/2023   Patient does check home blood sugars.   Home FSBS: BGs have been labile ranging between 112 and 180 Hypoglycemic episodes: No Patient has no new complaints of foot numbness or burning or loss of sensation. Pt reports no lesions on the feet currently.  Denies polyuria, polydipsia, polyphagia, changes in vision, N/T. Last diabetic eye exam (UTD)    No Known Allergies   Current Outpatient Medications:  .  ALPRAZolam (XANAX) 0.5 mg tablet, Take 0.5 mg by mouth nightly as needed., Disp: , Rfl:  .  amLODIPine  (NORVASC ) 10 mg tablet, Take 1 tablet (10 mg total) by mouth daily., Disp: 90 tablet, Rfl: 3 .  aspirin 81 mg EC tablet, Take 81 mg by mouth., Disp: , Rfl:  .  atorvastatin (LIPITOR) 40 mg tablet, Take 40 mg by mouth Once Daily., Disp: 90 tablet, Rfl:  3 .  escitalopram (LEXAPRO) 10 mg tablet, Take 1 tablet (10 mg total) by mouth daily., Disp: 90 tablet, Rfl: 1 .  freestyle 28 gauge misc, 1 each by miscellaneous route 4 (four) times a day., Disp: 100 each, Rfl: 3 .  freestyle 28 gauge misc, , Disp: , Rfl:  .  glucose blood (Blood Glucose Test) test strip, DX= E11.65. Use to check BS 4 times daily and as needed.  May substitute for any generic or brand covered by patient's insurance., Disp: 200 each, Rfl: 3 .  glucose monitoring kit kit, DX= E11.65. Use to check BS 4 times daily and as needed May substitute for any generic or brand covered by patient's insurance., Disp: 1 each, Rfl: 0 .  hydrOXYzine (ATARAX) 25 mg tablet, Take 25 mg by mouth 3 (three) times a day as needed. Indications: anxious, Disp: 30 tablet, Rfl: 3 .  insulin NPH (HumuLIN N NPH U-100 Insulin) 100 unit/mL injection, Inject 5 Units under the skin 2 (two) times a day with meals., Disp: 3 mL, Rfl: 2 .  insulin syringe-needle U-100 (Insulin Syringe MicroFine) 1 mL 27 gauge x 5/8 syrg, 1 Syringe by miscellaneous route 2 (two) times a day., Disp: 60 each, Rfl: 5 .  metFORMIN  (GLUCOPHAGE ) 1,000 mg tablet, Take 1 tablet (1,000 mg total) by mouth in the morning and 1 tablet (1,000 mg total) in the evening. Take with meals., Disp:  180 tablet, Rfl: 3 .  rosuvastatin (CRESTOR) 20 mg tablet, Take 1 tablet (20 mg total) by mouth every evening., Disp: 90 tablet, Rfl: 3 .  semaglutide (Ozempic) 0.25 mg or 0.5 mg (2 mg/3 mL) pen injector, Inject 0.5 mg under the skin once a week., Disp: 3 mL, Rfl: 2 .  sildenafiL, pulm.hypertension, (REVATIO) 20 mg tablet, TAKE 2 TABLETS BY MOUTH 1 HOUR BEFORE SEXUAL INTERCOURSE, Disp: 30 tablet, Rfl: 1 .  diclofenac  (VOLTAREN ) 75 mg EC tablet, Take 75 mg by mouth 2 (two) times a day for 14 days., Disp: 28 tablet, Rfl: 0 .  ergocalciferol (VITAMIN D2) 1,250 mcg (50,000 unit) capsule, Take 50,000 Units by mouth every 7 days for 12 doses., Disp: 12 capsule, Rfl:  0  The following portions of the patient's history were reviewed and updated as appropriate: allergies, current medications, past medical history, past social history, past family history, and problem list.   ROS   See HPI.  OBJECTIVE  BP (!) 168/116   Pulse 93   Temp 98.5 F (36.9 C)   Resp 16   Ht 1.803 m (5' 11)   Wt 112 kg (248 lb)   SpO2 96%   BMI 34.59 kg/m   GENERAL APPEARANCE: Well appearing, well developed, no acute distress. HEENT:  Conjunctiva without erythema or drainage. External auditory canals are non-swollen without exudate present. Tympanic membranes are normal. Nasal turbinates are without edema or drainage. Oropharynx is without lesion or exudate.       NECK: Supple without lymphadenopathy, bruit, or thyromegaly. LUNGS: Clear to auscultation bilaterally HEART: Regular rate and rhythm without murmur. ABDOMEN: Normal bowel sounds, soft, non-tender, without hepato-splenomegaly EXTREMITIES: No edema NEUROLOGIC: Alert and oriented x3, no obvious deficits.  SKIN:  No rashes or abnormal lesions visible.   ASSESSMENT/PLAN   1. Type 2 diabetes mellitus with hyperglycemia, with long-term current use of insulin (HCC) (Primary) Chronic, uncertain of control No longer taking ozempic, currently taking metformin  Recommend continuing metformin   will consider adding back ozempic if hemogoblin A1c not at goal Reduce sugars and starches, increase fiber.  Increase exercise.  Work on weight loss.  Monitor.    2. Benign essential HTN Chronic, unstable Will add hydrochlorothiazide 12.5 mg to amlodipine  Chronic, stable.  Taking medications as rx'ed.  Continue current regimen.  Check bp readings at home and keep log. Follow low salt diet. Avoid NSAIDs.  - hydroCHLOROthiazide (HYDRODIURIL) 12.5 mg tablet; Take 1 tablet (12.5 mg total) by mouth daily.  Dispense: 30 tablet; Refill: 3  3. Mixed hyperlipidemia Chronic, uncertain of control Fasting labs ordered Taking  medications as rx'ed.  Continue current regimen.  Recommend regular exercise and lipid lowering diet.  Increase fiber.     Return in about 3 months (around 01/08/2024) for Annual physical.   This document serves as a record of services personally performed by Daron Denise, FNP. It was created on their behalf by Renie LOISE Birmingham, CMA, a trained medical scribe, and Registered Medical Assistant (RMA). During the course of documenting the history, physical exam and medical decision making, I was functioning as a Stage manager. The creation of this record is the provider's dictation and/or activities during the visit.                Electronically signed by: Renie LOISE Birmingham, CMA 10/07/2023 11:10 AM  I agree the documentation is accurate and complete.  Electronically signed by: Daron Denise Mace, NP 10/10/2023 1:46 PM

## 2023-10-09 ENCOUNTER — Other Ambulatory Visit: Payer: Self-pay

## 2023-10-09 ENCOUNTER — Emergency Department (HOSPITAL_BASED_OUTPATIENT_CLINIC_OR_DEPARTMENT_OTHER): Payer: Self-pay

## 2023-10-09 ENCOUNTER — Encounter (HOSPITAL_BASED_OUTPATIENT_CLINIC_OR_DEPARTMENT_OTHER): Payer: Self-pay | Admitting: Emergency Medicine

## 2023-10-09 DIAGNOSIS — Z7901 Long term (current) use of anticoagulants: Secondary | ICD-10-CM | POA: Insufficient documentation

## 2023-10-09 DIAGNOSIS — Z794 Long term (current) use of insulin: Secondary | ICD-10-CM | POA: Insufficient documentation

## 2023-10-09 DIAGNOSIS — M7989 Other specified soft tissue disorders: Secondary | ICD-10-CM | POA: Insufficient documentation

## 2023-10-09 DIAGNOSIS — Z7982 Long term (current) use of aspirin: Secondary | ICD-10-CM | POA: Insufficient documentation

## 2023-10-09 DIAGNOSIS — Z7984 Long term (current) use of oral hypoglycemic drugs: Secondary | ICD-10-CM | POA: Insufficient documentation

## 2023-10-09 DIAGNOSIS — Z79899 Other long term (current) drug therapy: Secondary | ICD-10-CM | POA: Insufficient documentation

## 2023-10-09 DIAGNOSIS — M79642 Pain in left hand: Secondary | ICD-10-CM | POA: Insufficient documentation

## 2023-10-09 DIAGNOSIS — E119 Type 2 diabetes mellitus without complications: Secondary | ICD-10-CM | POA: Insufficient documentation

## 2023-10-09 DIAGNOSIS — I10 Essential (primary) hypertension: Secondary | ICD-10-CM | POA: Insufficient documentation

## 2023-10-09 MED ORDER — IBUPROFEN 400 MG PO TABS
400.0000 mg | ORAL_TABLET | Freq: Once | ORAL | Status: AC | PRN
  Administered 2023-10-09: 400 mg via ORAL
  Filled 2023-10-09: qty 1

## 2023-10-09 NOTE — ED Triage Notes (Signed)
Left hand swelling stated on Sunday/Monday, denies injury or bite. Pain full to move or palpate.

## 2023-10-10 ENCOUNTER — Emergency Department (HOSPITAL_BASED_OUTPATIENT_CLINIC_OR_DEPARTMENT_OTHER)
Admission: EM | Admit: 2023-10-10 | Discharge: 2023-10-10 | Disposition: A | Discharge status: Home / Self Care | Payer: Self-pay | Attending: Emergency Medicine | Admitting: Emergency Medicine

## 2023-10-10 DIAGNOSIS — M7989 Other specified soft tissue disorders: Secondary | ICD-10-CM

## 2023-10-10 DIAGNOSIS — Z7901 Long term (current) use of anticoagulants: Secondary | ICD-10-CM

## 2023-10-10 MED ORDER — CEPHALEXIN 250 MG PO CAPS
500.0000 mg | ORAL_CAPSULE | Freq: Once | ORAL | Status: AC
  Administered 2023-10-10: 500 mg via ORAL
  Filled 2023-10-10: qty 2

## 2023-10-10 MED ORDER — CEFADROXIL 500 MG PO CAPS: 500.0000 mg | ORAL_CAPSULE | Freq: Two times a day (BID) | ORAL | 0 refills | Status: AC

## 2023-10-10 NOTE — ED Provider Notes (Signed)
[facility] [facility]GENC[facility] DEPARTMENT AT [facility] Provider Note   CSN: [id number] Arrival date & time: 10/09/23  2158     History  Chief Complaint  Patient presents with   Hand Problem    Jerome Dickerson is a 47 y.o. male.  The history is provided by the patient.  He has history of hypertension, diabetes, hyperlipidemia, DVT anticoagulated on rivaroxaban and comes in because of pain and swelling in his left hand which started 4 days ago and is progressing.  He denies fever or chills.  He denies any trauma.  He has not taken anything for pain.  He has continued to work even though his and is swollen.   Home Medications Prior to Admission medications   Medication Sig Start Date End Date Taking? Authorizing Provider  amLODipine (NORVASC) 5 MG tablet Take by mouth. 06/08/21   [provider]  aspirin EC 81 MG tablet Take 81 mg by mouth daily. Swallow whole.    [provider]  atorvastatin (LIPITOR) 40 MG tablet Take 1 tablet by mouth daily. 06/18/22   [provider]  blood glucose meter kit and supplies 1 each by Other route as needed for other. 09/29/20   [provider]  diclofenac Sodium (VOLTAREN ARTHRITIS PAIN) 1 % GEL Apply 4 g topically 4 (four) times daily. 07/23/22   Harris, Cammy Copa, PA-C  empagliflozin (JARDIANCE) 25 MG TABS tablet Take 1 tablet by mouth daily. 06/18/22   [provider]  escitalopram (LEXAPRO) 10 MG tablet Take 1 tablet by mouth daily. 06/08/21   [provider]  hydrOXYzine (ATARAX/VISTARIL) 25 MG tablet Take 1 tablet by mouth 3 (three) times daily as needed. 06/08/21   [provider]  insulin NPH Human (NOVOLIN N) 100 UNIT/ML injection Inject into the skin. 06/08/21   [provider]  Lancets (FREESTYLE) lancets 1 each by Other route as needed. 09/29/20   [provider]  metFORMIN (GLUCOPHAGE) 1000 MG tablet Take by mouth. 06/11/21   [provider]  RIVAROXABAN  Carlena Hurl) VTE STARTER PACK (15 & 20 MG) Follow package directions: Take one 15mg  tablet by mouth twice a day. On day 22, switch to one 20mg  tablet once a day. Take with food. 06/14/21   Caccavale, Sophia, PA-C  Vitamin D, Ergocalciferol, (DRISDOL) 1.25 MG (50000 UNIT) CAPS capsule Take 50,000 Units by mouth once a week. 05/31/22   [provider]      Allergies    Patient has no known allergies.    Review of Systems   Review of Systems  All other systems reviewed and are negative.   Physical Exam Updated Vital Signs BP (!) 167/109 (BP Location: Right Arm)   Pulse (!) 108   Temp 99.7 F (37.6 C) (Oral)   Resp 16   Ht 5\' 9"  (1.753 m)   Wt 111.1 kg   SpO2 96%   BMI 36.18 kg/m  Physical Exam Vitals and nursing note reviewed.   47 year old male, resting comfortably and in no acute distress. Vital signs are significant for elevated blood pressure and slightly elevated heart rate. Oxygen saturation is 96%, which is normal. Head is normocephalic and atraumatic. PERRLA, EOMI.  Lungs are clear without rales, wheezes, or rhonchi. Chest is nontender. Heart has regular rate and rhythm without murmur. Abdomen is soft, flat, nontender. Extremities: There is generalized swelling of the left hand and fingers.  There is tenderness to palpation over the palmar surface overlying the second and third metacarpals.  There is pain with passive extension of the left second and third fingers.  Capillary refill is prompt and sensation is normal. Skin is warm and dry without rash. Neurologic: Mental status is normal, moves all extremities equally.  ED Results / Procedures / Treatments    Radiology DG Hand Complete Left Result Date: 10/09/2023 CLINICAL DATA:  Atraumatic left hand pain and swelling. EXAM: LEFT HAND - COMPLETE 3+ VIEW COMPARISON:  None Available. FINDINGS: There is no evidence of fracture or dislocation. There is no evidence of arthropathy or other focal bone abnormality. Moderate  severity diffuse soft tissue swelling is seen, most prominent along the dorsal aspect of the left hand. IMPRESSION: Moderate severity diffuse soft tissue swelling without an acute osseous abnormality. Electronically Signed   By: Aram Candela M.D.   On: 10/09/2023 23:11    Procedures Procedures    Medications Ordered in ED Medications  cephALEXin (KEFLEX) capsule 500 mg (has no administration in time range)  ibuprofen (ADVIL) tablet 400 mg (400 mg Oral Given 10/09/23 2242)    ED Course/ Medical Decision Making/ A&P                                 Medical Decision Making Amount and/or Complexity of Data Reviewed Radiology: ordered.  Risk Prescription drug management.   Swelling of the left hand, etiology unclear.  No definite signs of infection present, only localizing findings are tenderness over the second and third metacarpals and pain on passive extension of the second and third fingers.  Picture is not typical of flexor tenosynovitis since it is the entire hand that is swollen.  I am prescribing cefadroxil and I am referring him to hand surgery for follow-up.  Final Clinical Impression(s) / ED Diagnoses Final diagnoses:  Swelling of left hand  Chronic anticoagulation    Rx / DC Orders ED Discharge Orders          Ordered    cefadroxil (DURICEF) 500 MG capsule  2 times daily        10/10/23 0051              Dione Booze, MD 10/10/23 [id number]

## 2023-10-10 NOTE — Discharge Instructions (Signed)
Apply ice several times a day.  You may take acetaminophen for pain.  Return if pain is getting worse or if you start running a fever.

## 2023-12-08 NOTE — Progress Notes (Addendum)
 Patient presents for  Chief Complaint  Patient presents with  . Possible Gout  . Follow-up   Jerome Dickerson presents to primary care clinic for ongoing management of Possible Gout.  SUBJECTIVE   Pt is be evaluated for possible gout x 5 days, second time around from last month, pt stating, on lower left lower leg,ankle area and left foot. Denies any injury. Pt stating family history of Gout-like symptoms. Pain Scale 7-8/10. Pt has taken Tylenol ,soaking with Epsom Salt with little relief. Took a friends medication that had gout and it helped a lot   Symptoms including: Pain: Yes Swelling: Yes Redness: Yes Warmth: Yes Tenderness: Yes Stiffness: Yes  New Problems/Concerns:  None at this time.  No Known Allergies   Current Outpatient Medications:  .  ALPRAZolam (XANAX) 0.5 mg tablet, Take 0.5 mg by mouth nightly as needed., Disp: , Rfl:  .  amLODIPine  (NORVASC ) 10 mg tablet, Take 1 tablet (10 mg total) by mouth daily., Disp: 90 tablet, Rfl: 3 .  aspirin 81 mg EC tablet, Take 81 mg by mouth., Disp: , Rfl:  .  atorvastatin (LIPITOR) 40 mg tablet, Take 40 mg by mouth Once Daily., Disp: 90 tablet, Rfl: 3 .  cefaDROXiL  500 mg capsule, Take 500 mg by mouth., Disp: , Rfl:  .  empagliflozin (JARDIANCE) 25 mg tab, Take 1 tablet by mouth daily., Disp: , Rfl:  .  escitalopram (LEXAPRO) 10 mg tablet, Take 1 tablet (10 mg total) by mouth daily., Disp: 90 tablet, Rfl: 1 .  freestyle 28 gauge misc, 1 each by miscellaneous route 4 (four) times a day., Disp: 100 each, Rfl: 3 .  freestyle 28 gauge misc, , Disp: , Rfl:  .  glucose blood (Blood Glucose Test) test strip, DX= E11.65. Use to check BS 4 times daily and as needed.  May substitute for any generic or brand covered by patient's insurance., Disp: 200 each, Rfl: 3 .  glucose monitoring kit kit, DX= E11.65. Use to check BS 4 times daily and as needed May substitute for any generic or brand covered by patient's insurance., Disp: 1 each, Rfl:  0 .  hydroCHLOROthiazide (HYDRODIURIL) 12.5 mg tablet, Take 1 tablet (12.5 mg total) by mouth daily., Disp: 30 tablet, Rfl: 3 .  hydrOXYzine (ATARAX) 25 mg tablet, Take 25 mg by mouth 3 (three) times a day as needed. Indications: anxious, Disp: 30 tablet, Rfl: 3 .  insulin NPH (HumuLIN N NPH U-100 Insulin) 100 unit/mL injection, Inject 5 Units under the skin 2 (two) times a day with meals., Disp: 3 mL, Rfl: 2 .  insulin syringe-needle U-100 (Insulin Syringe MicroFine) 1 mL 27 gauge x 5/8 syrg, 1 Syringe by miscellaneous route 2 (two) times a day., Disp: 60 each, Rfl: 5 .  metFORMIN  (GLUCOPHAGE ) 1,000 mg tablet, Take 1 tablet (1,000 mg total) by mouth in the morning and 1 tablet (1,000 mg total) in the evening. Take with meals., Disp: 180 tablet, Rfl: 3 .  semaglutide (Ozempic) 0.25 mg or 0.5 mg (2 mg/3 mL) pen injector, Inject 0.5 mg under the skin once a week., Disp: 3 mL, Rfl: 2 .  sildenafiL, pulm.hypertension, (REVATIO) 20 mg tablet, TAKE 2 TABLETS BY MOUTH 1 HOUR BEFORE SEXUAL INTERCOURSE, Disp: 30 tablet, Rfl: 1 .  diazePAM (Valium) 10 mg tablet, Take 2 tablets (20 mg total) by mouth once for 1 dose., Disp: 2 tablet, Rfl: 0 .  diclofenac  (VOLTAREN ) 75 mg EC tablet, Take 75 mg by mouth 2 (two) times a day  for 14 days., Disp: 28 tablet, Rfl: 0 .  ergocalciferol (VITAMIN D2) 1,250 mcg (50,000 unit) capsule, Take 50,000 Units by mouth every 7 days for 12 doses., Disp: 12 capsule, Rfl: 0  The following portions of the patient's history were reviewed and updated as appropriate: allergies, current medications, past medical history, past social history, past family history, and problem list.   ROS   See HPI.  OBJECTIVE  BP (!) 158/109 (BP Location: Left arm, Patient Position: Sitting)   Pulse 91   Temp 98.1 F (36.7 C) (Oral)   Ht 1.803 m (5' 11)   Wt 112 kg (247 lb 6.4 oz)   SpO2 98%   BMI 34.51 kg/m   GENERAL APPEARANCE: Well appearing, well developed, no acute distress. LUNGS: Clear  to auscultation bilaterally HEART: Regular rate and rhythm without murmur. EXTREMITIES: Left foot with mild non-pitting edema and warmth to touch, TTP, specifically on anterior exterior aspect of foot.  NEUROLOGIC: Alert and oriented x3, no obvious deficits.  SKIN:  No rashes or abnormal lesions visible.   ASSESSMENT/PLAN   1. Acute gout of left foot, unspecified cause (Primary) Will provide trial of colchicine for likely gout flare, instructions reviewed.  Encouraged to rest, ice and elevate extremity.  Encouraged patient to be mindful of diet to prevent flares, packet attached to AVS with more information on gout. - colchicine 0.6 mg tablet; Take 2 tablets (1.2 mg) at the first sign of gout flare, followed by 1 tablet (0.6 mg) after 1 hour. Continue taking 1 tablet (0.6 mg) once daily until gout flare resolves. If not resolving after 3 days, please contact provider.  Dispense: 30 tablet; Refill: 3  Follow up if symptoms fail to improve or worsen.   This document serves as a record of services personally performed by Odis Petite, FNP.  It was created on their behalf by Kenney JONELLE Diver, CMA, a trained medical scribe, and Certified Medical Assistant (CMA). During the course of documenting the history, physical exam and medical decision making, I was functioning as a Stage manager. The creation of this record is the provider's dictation and/or activities during the visit.  Electronically signed by Kenney JONELLE Diver, CMA 12/08/2023 4:32 PM  I agree the documentation is accurate and complete.   Electronically signed by: Morene Eck Roddy, FNP [facility] 12/08/2023 4:45 PM

## 2024-05-25 ENCOUNTER — Other Ambulatory Visit: Payer: Self-pay

## 2024-05-25 ENCOUNTER — Emergency Department (HOSPITAL_BASED_OUTPATIENT_CLINIC_OR_DEPARTMENT_OTHER): Admission: EM | Admit: 2024-05-25 | Discharge: 2024-05-25 | Disposition: A | Discharge status: Home / Self Care

## 2024-05-25 ENCOUNTER — Encounter (HOSPITAL_BASED_OUTPATIENT_CLINIC_OR_DEPARTMENT_OTHER): Payer: Self-pay

## 2024-05-25 DIAGNOSIS — R7309 Other abnormal glucose: Secondary | ICD-10-CM | POA: Insufficient documentation

## 2024-05-25 DIAGNOSIS — Z7901 Long term (current) use of anticoagulants: Secondary | ICD-10-CM | POA: Diagnosis not present

## 2024-05-25 DIAGNOSIS — Z7982 Long term (current) use of aspirin: Secondary | ICD-10-CM | POA: Diagnosis not present

## 2024-05-25 DIAGNOSIS — R739 Hyperglycemia, unspecified: Secondary | ICD-10-CM

## 2024-05-25 DIAGNOSIS — R519 Headache, unspecified: Secondary | ICD-10-CM | POA: Insufficient documentation

## 2024-05-25 LAB — CBC
HCT: 42.1 % (ref 39.0–52.0)
Hemoglobin: 14.2 g/dL (ref 13.0–17.0)
MCH: 28.2 pg (ref 26.0–34.0)
MCHC: 33.7 g/dL (ref 30.0–36.0)
MCV: 83.7 fL (ref 80.0–100.0)
Platelets: 307 K/uL (ref 150–400)
RBC: 5.03 MIL/uL (ref 4.22–5.81)
RDW: 11.2 % — ABNORMAL LOW (ref 11.5–15.5)
WBC: 4.6 K/uL (ref 4.0–10.5)
nRBC: 0 % (ref 0.0–0.2)

## 2024-05-25 LAB — CBG MONITORING, ED: Glucose-Capillary: 400 mg/dL — ABNORMAL HIGH (ref 70–99)

## 2024-05-25 LAB — BASIC METABOLIC PANEL WITH GFR
Anion gap: 15 (ref 5–15)
BUN: 12 mg/dL (ref 6–20)
CO2: 23 mmol/L (ref 22–32)
Calcium: 10 mg/dL (ref 8.9–10.3)
Chloride: 96 mmol/L — ABNORMAL LOW (ref 98–111)
Creatinine, Ser: 0.94 mg/dL (ref 0.61–1.24)
GFR, Estimated: >60 mL/min (ref >60)
Glucose, Bld: 418 mg/dL — ABNORMAL HIGH (ref 70–99)
Potassium: 4.4 mmol/L (ref 3.5–5.1)
Sodium: 134 mmol/L — ABNORMAL LOW (ref 135–145)

## 2024-05-25 MED ORDER — METFORMIN HCL 1000 MG PO TABS: 1000.0000 mg | ORAL_TABLET | Freq: Every day | ORAL | 0 refills | Status: AC

## 2024-05-25 MED ORDER — ACETAMINOPHEN 325 MG PO TABS: 650.0000 mg | ORAL_TABLET | Freq: Once | ORAL | Status: DC

## 2024-05-25 MED ORDER — FREESTYLE LANCETS MISC: 1.0000 | 0 refills | Status: AC | PRN

## 2024-05-25 MED ORDER — METFORMIN HCL 500 MG PO TABS
1000.0000 mg | ORAL_TABLET | Freq: Once | ORAL | Status: AC
  Administered 2024-05-25 (×2): 1000 mg via ORAL
  Filled 2024-05-25: qty 2

## 2024-05-25 NOTE — Discharge Instructions (Addendum)
 Please pick up your blood sugar medications and start taking them.  Follow-up with your primary doctor.

## 2024-05-25 NOTE — ED Provider Notes (Signed)
 [facility] Provider Note   CSN: [id number] Arrival date & time: 05/25/24  0740     Patient presents with: Headache   Jerome Dickerson is a 48 y.o. male.   This is a 48 year old male requesting to be checked out as he has been feeling  down and generally not well.  No specific complaint.  Has been having a lot of life stressors and thinks he may be feeling bad secondary to stress.  Reports mild headache on ROS, gradual onset.  No vision loss facial droop unilateral weakness or fevers.     Headache      Prior to Admission medications   Medication Sig Start Date End Date Taking? Authorizing Provider  amLODipine  (NORVASC ) 5 MG tablet Take by mouth. 06/08/21   [provider]  aspirin EC 81 MG tablet Take 81 mg by mouth daily. Swallow whole.    [provider]  atorvastatin (LIPITOR) 40 MG tablet Take 1 tablet by mouth daily. 06/18/22   [provider]  blood glucose meter kit and supplies 1 each by Other route as needed for other. 09/29/20   [provider]  cefadroxil  (DURICEF) 500 MG capsule Take 1 capsule (500 mg total) by mouth 2 (two) times daily. 10/10/23   Raford Lenis, MD  diclofenac  Sodium (VOLTAREN  ARTHRITIS PAIN) 1 % GEL Apply 4 g topically 4 (four) times daily. 07/23/22   Harris, Abigail, PA-C  empagliflozin (JARDIANCE) 25 MG TABS tablet Take 1 tablet by mouth daily. 06/18/22   [provider]  escitalopram (LEXAPRO) 10 MG tablet Take 1 tablet by mouth daily. 06/08/21   [provider]  hydrOXYzine (ATARAX/VISTARIL) 25 MG tablet Take 1 tablet by mouth 3 (three) times daily as needed. 06/08/21   [provider]  insulin NPH Human (NOVOLIN N) 100 UNIT/ML injection Inject into the skin. 06/08/21   [provider]  Lancets (FREESTYLE) lancets 1 each by Other route as needed. 09/29/20   [provider]  metFORMIN  (GLUCOPHAGE ) 1000 MG tablet Take by mouth.  06/11/21   [provider]  RIVAROXABAN  (XARELTO ) VTE STARTER PACK (15 & 20 MG) Follow package directions: Take one 15mg  tablet by mouth twice a day. On day 22, switch to one 20mg  tablet once a day. Take with food. 06/14/21   Caccavale, Sophia, PA-C  Vitamin D, Ergocalciferol, (DRISDOL) 1.25 MG (50000 UNIT) CAPS capsule Take 50,000 Units by mouth once a week. 05/31/22   [provider]    Allergies: Patient has no known allergies.    Review of Systems  Neurological:  Positive for headaches.    Updated Vital Signs BP (!) 140/96   Pulse 88   Temp 99.1 F (37.3 C) (Oral)   Resp 18   SpO2 97%   Physical Exam Vitals and nursing note reviewed.  Cardiovascular:     Rate and Rhythm: Normal rate and regular rhythm.  Pulmonary:     Effort: Pulmonary effort is normal.     Breath sounds: Normal breath sounds.  Abdominal:     Palpations: Abdomen is soft.  Skin:    General: Skin is warm and dry.  Neurological:     GCS: GCS eye subscore is 4. GCS verbal subscore is 5. GCS motor subscore is 6.     Cranial Nerves: No cranial nerve deficit.     Sensory: No sensory deficit.     Motor: No weakness.     Coordination: Coordination normal.  Gait: Gait normal.  Psychiatric:        Mood and Affect: Mood normal.        Behavior: Behavior normal.     (all labs ordered are listed, but only abnormal results are displayed) Labs Reviewed  CBC - Abnormal; Notable for the following components:      Result Value   RDW 11.2 (*)    All other components within normal limits  BASIC METABOLIC PANEL WITH GFR - Abnormal; Notable for the following components:   Sodium 134 (*)    Chloride 96 (*)    Glucose, Bld 418 (*)    All other components within normal limits  CBG MONITORING, ED - Abnormal; Notable for the following components:   Glucose-Capillary 400 (*)    All other components within normal limits    EKG: None  Radiology: No results found.   Procedures   Medications  Ordered in the ED  acetaminophen  (TYLENOL ) tablet 650 mg (650 mg Oral Not Given 05/25/24 0821)  metFORMIN  (GLUCOPHAGE ) tablet 1,000 mg (has no administration in time range)    Clinical Course as of 05/25/24 0842  Tue May 25, 2024  0826 Glucose-Capillary(!): 400 [TY]  9173 Basic metabolic panel(!) Hyperglycemia, but no bicarb or anion gap to suggest DKA. [TY]  0827 CBC(!) No leukocytosis.  No anemia [TY]    Clinical Course User Index [TY] Neysa Caron PARAS, DO                                 Medical Decision Making This is a 48 year old male presenting emergency department requesting to be checked out for feeling down he is afebrile, nontachycardic.  Hypertensive.  Did endorse mild headache on ROS. physical exam reassuring without neurodeficits.  Clinically well-appearing.  Labs showed hyperglycemia, but not in DKA.  On further interview, he notes that he had does have a pill that he takes, but has been out of it for a week or 2.  He is not sure what medication it is.  Per chart review from PCP visit 6 months ago.  He is on metformin .  Will give him a dose here.  He he notes that he does have a refill prescription that he has not picked up.  Encouraged him to do so and follow-up with his PCP.  No acute emergent condition identified, stable for discharge.  Amount and/or Complexity of Data Reviewed Labs: ordered. Decision-making details documented in ED Course.  Risk OTC drugs. Prescription drug management.       Final diagnoses:  None    ED Discharge Orders     None          Neysa Caron PARAS, [place] 05/25/24 [phone]

## 2024-05-25 NOTE — ED Notes (Signed)
 Pt alert and oriented X 4 at the time of discharge. RR even and unlabored. No acute distress noted. Pt verbalized understanding of discharge instructions as discussed. Pt ambulatory to lobby at time of discharge.
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
